# Patient Record
Sex: Female | Born: 1985 | Hispanic: Yes | Marital: Married | Smoking: Former smoker
Health system: Southern US, Community
[De-identification: ages and names within clinical notes are randomized; demographics above are authoritative.]

## PROBLEM LIST (undated history)

## (undated) DIAGNOSIS — R87629 Unspecified abnormal cytological findings in specimens from vagina: Secondary | ICD-10-CM

## (undated) DIAGNOSIS — O1490 Unspecified pre-eclampsia, unspecified trimester: Secondary | ICD-10-CM

## (undated) DIAGNOSIS — F32A Depression, unspecified: Secondary | ICD-10-CM

## (undated) DIAGNOSIS — Z8619 Personal history of other infectious and parasitic diseases: Secondary | ICD-10-CM

## (undated) DIAGNOSIS — A630 Anogenital (venereal) warts: Secondary | ICD-10-CM

## (undated) DIAGNOSIS — F329 Major depressive disorder, single episode, unspecified: Secondary | ICD-10-CM

## (undated) DIAGNOSIS — G43909 Migraine, unspecified, not intractable, without status migrainosus: Secondary | ICD-10-CM

## (undated) DIAGNOSIS — O24419 Gestational diabetes mellitus in pregnancy, unspecified control: Secondary | ICD-10-CM

## (undated) DIAGNOSIS — Z8742 Personal history of other diseases of the female genital tract: Secondary | ICD-10-CM

## (undated) HISTORY — DX: Unspecified pre-eclampsia, unspecified trimester: O14.90

## (undated) HISTORY — DX: Anogenital (venereal) warts: A63.0

## (undated) HISTORY — PX: REFRACTIVE SURGERY: SHX103

## (undated) HISTORY — DX: Depression, unspecified: F32.A

## (undated) HISTORY — DX: Major depressive disorder, single episode, unspecified: F32.9

## (undated) HISTORY — DX: Personal history of other diseases of the female genital tract: Z87.42

## (undated) HISTORY — DX: Migraine, unspecified, not intractable, without status migrainosus: G43.909

## (undated) HISTORY — DX: Unspecified abnormal cytological findings in specimens from vagina: R87.629

## (undated) HISTORY — DX: Personal history of other infectious and parasitic diseases: Z86.19

---

## 2018-05-02 ENCOUNTER — Encounter (INDEPENDENT_AMBULATORY_CARE_PROVIDER_SITE_OTHER): Payer: Self-pay

## 2018-05-02 ENCOUNTER — Ambulatory Visit: Payer: Managed Care, Other (non HMO) | Admitting: Internal Medicine

## 2018-05-02 ENCOUNTER — Encounter: Payer: Self-pay | Admitting: Internal Medicine

## 2018-05-02 VITALS — BP 120/78 | HR 68 | Temp 98.1°F | Ht 63.0 in | Wt 178.0 lb

## 2018-05-02 DIAGNOSIS — F325 Major depressive disorder, single episode, in full remission: Secondary | ICD-10-CM | POA: Diagnosis not present

## 2018-05-02 DIAGNOSIS — G43C1 Periodic headache syndromes in child or adult, intractable: Secondary | ICD-10-CM

## 2018-05-02 HISTORY — DX: Major depressive disorder, single episode, in full remission: F32.5

## 2018-05-02 HISTORY — DX: Periodic headache syndromes in child or adult, intractable: G43.C1

## 2018-05-02 MED ORDER — SUMATRIPTAN SUCCINATE 25 MG PO TABS: 25.0000 mg | ORAL_TABLET | ORAL | 0 refills | Status: DC | PRN

## 2018-05-02 NOTE — Assessment & Plan Note (Signed)
Currently not an issue Will monitor 

## 2018-05-02 NOTE — Progress Notes (Signed)
HPI  Pt presents to the clinic today to establish care and for management of the conditions listed below. [facility].  Depression: Diagnosed as a teenager. She has taken medication in the past but she reports she stopped it because she did not feel herself. She has seen a therapist in the past with good relief.  Migraines: Triggered by dehydration, stress, menstrual cycles. She reports these occur 1-3 x a month. She is taking Axert and reports some times it works, some times it doesn't. She has also tried Tylenol, Excedrin Migraine with minimal relief.  Flu: never Tetanus: < 10 years Pap Smear: 06/2017 Dentist: annually  Past Medical History:  Diagnosis Date  . Depression   . Genital warts   . History of chicken pox   . Migraine     Current Outpatient Medications  Medication Sig Dispense Refill  . almotriptan (AXERT) 12.5 MG tablet      No current facility-administered medications for this visit.     Allergies  Allergen Reactions  . Fluconazole Hives  . Sulfamethoxazole-Trimethoprim Hives and Swelling    Angioedema    Family History  Problem Relation Age of Onset  . Miscarriages / [place] Mother   . Hypertension Mother   . Esophageal cancer Maternal Grandmother   . Hyperlipidemia Maternal Grandmother   . Hypertension Maternal Grandmother   . Alcohol abuse Maternal Grandfather   . Hyperlipidemia Paternal Grandmother   . Hypertension Paternal Grandmother   . Hyperlipidemia Paternal Grandfather   . Hypertension Paternal Grandfather   . Liver cancer Paternal Grandfather     Social History   Socioeconomic History  . Marital status: Single    Spouse name: Not on file  . Number of children: Not on file  . Years of education: Not on file  . Highest education level: Not on file  Occupational History  . Not on file  Social Needs  . Financial resource strain: Not on file  . Food insecurity:    Worry: Not on file     Inability: Not on file  . Transportation needs:    Medical: Not on file    Non-medical: Not on file  Tobacco Use  . Smoking status: Current Every Day Smoker    Packs/day: 0.18    Types: Cigarettes  . Smokeless tobacco: Never Used  Substance and Sexual Activity  . Alcohol use: Yes    Frequency: Never    Comment: rare  . Drug use: Never  . Sexual activity: Not on file  Lifestyle  . Physical activity:    Days per week: Not on file    Minutes per session: Not on file  . Stress: Not on file  Relationships  . Social connections:    Talks on phone: Not on file    Gets together: Not on file    Attends religious service: Not on file    Active member of club or organization: Not on file    Attends meetings of clubs or organizations: Not on file    Relationship status: Not on file  . Intimate partner violence:    Fear of current or ex partner: Not on file    Emotionally abused: Not on file    Physically abused: Not on file    Forced sexual activity: Not on file  Other Topics Concern  . Not on file  Social History Narrative  . Not on file    ROS:  Constitutional: Pt reports migraines.  Denies fever, malaise, fatigue, or abrupt weight changes.  HEENT: Denies eye pain, eye redness, ear pain, ringing in the ears, wax buildup, runny nose, nasal congestion, bloody nose, or sore throat. Respiratory: Denies difficulty breathing, shortness of breath, cough or sputum production.   Cardiovascular: Denies chest pain, chest tightness, palpitations or swelling in the hands or feet.  Gastrointestinal: Denies abdominal pain, bloating, constipation, diarrhea or blood in the stool.  GU: Denies frequency, urgency, pain with urination, blood in urine, odor or discharge. Musculoskeletal: Denies decrease in range of motion, difficulty with gait, muscle pain or joint pain and swelling.  Skin: Denies redness, rashes, lesions or ulcercations.  Neurological: Denies dizziness, difficulty with memory,  difficulty with speech or problems with balance and coordination.  Psych: Denies anxiety, depression, SI/HI.  No other specific complaints in a complete review of systems (except as listed in HPI above).  PE:  BP 120/78   Pulse 68   Temp 98.1 F (36.7 C) (Oral)   Ht 5\' 3"  (1.6 m)   Wt 178 lb (80.7 kg)   LMP 04/20/2018   SpO2 98%   BMI 31.53 kg/m   Wt Readings from Last 3 Encounters:  05/02/18 178 lb (80.7 kg)    General: Appears her stated age, well developed, well nourished in NAD. Skin: Dry and intact. Cardiovascular: Normal rate and rhythm.  Pulmonary/Chest: Normal effort and positive vesicular breath sounds. No respiratory distress. No wheezes, rales or ronchi noted.  Neurological: Alert and oriented. Coordination normal.  Psychiatric: Mood and affect normal. Behavior is normal. Judgment and thought content normal.     Assessment and Plan:

## 2018-05-02 NOTE — Patient Instructions (Signed)

## 2018-05-02 NOTE — Assessment & Plan Note (Signed)
D/c Axert RX for Imitrex 25 mg tabs- use as directed Avoid triggers

## 2018-06-13 ENCOUNTER — Telehealth: Payer: Self-pay | Admitting: Internal Medicine

## 2018-06-13 NOTE — Telephone Encounter (Signed)
Copied from CRM [id number]. Topic: Quick Communication - See Telephone Encounter >> Jun 13, 2018  4:08 PM Terisa Starraylor, Brittany L wrote: CRM for notification. See Telephone encounter for: 06/13/18.  Patient woke up this morning with a cold sore right under her lip. She has been using Abreva and she feels like the sore is getting slightly bigger and was wondering if Nicki ReaperRegina Baity would give her something for it.   [id number] - [place], [place] - [address]

## 2018-06-13 NOTE — Telephone Encounter (Signed)
I spoke with pt;pt established care on 05/02/18;offered pt appt to be seen for fever blister since Abreva is not helping; pt has CPX scheduled on 06/25/18 and pt cannot afford 2 copays. Pt request antiviral to [facility] [place]. Pt request cb on 06/14/18 after Pamala Hurry Baity NP reviews. Pt said fever blister started as 3 small fever blisters that has turned into 1 blister under lower lip on rt side about the size of a pencil eraser in crescent shape. Pt said is painful and needs prescription med. Please advise.

## 2018-06-14 NOTE — Telephone Encounter (Signed)
She has to be seen. She can try an E visit if she can't come in for an appt

## 2018-06-25 ENCOUNTER — Encounter: Payer: Managed Care, Other (non HMO) | Admitting: Internal Medicine

## 2018-11-28 NOTE — L&D Delivery Note (Signed)
Delivery Note At 2:39 AM a viable and healthy female was delivered via Vaginal, Spontaneous, left hand compound (Presentation:vtx ; LOA ).  APGAR: 9, 9; weight pending .   Placenta status: spontaneous ,intact, not sent  .  Cord: CAN x 1  with the following complications: .  Cord pH: none  Anesthesia:  epidural Episiotomy:  none Lacerations: 2nd degree Perineal; right vaginal Sulcus;Labial majus bilateral Suture Repair: 3.0 chromic Est. Blood Loss (mL): 243  Mom to postpartum.  Baby to Couplet care / Skin to Skin.  Bailey Schwartz A Ellison Leisure 02/05/2019, 3:22 AM

## 2018-12-24 ENCOUNTER — Encounter: Payer: Self-pay | Admitting: Skilled Nursing Facility1

## 2018-12-24 ENCOUNTER — Encounter: Payer: Managed Care, Other (non HMO) | Attending: Obstetrics and Gynecology | Admitting: Skilled Nursing Facility1

## 2018-12-24 DIAGNOSIS — O24419 Gestational diabetes mellitus in pregnancy, unspecified control: Secondary | ICD-10-CM | POA: Insufficient documentation

## 2018-12-24 NOTE — Progress Notes (Signed)
Diabetes Self-Management Education  Visit Type: First/Initial  12/24/2018  Ms. Bailey Schwartz, identified by name and date of birth, is a 33 y.o. female with a diagnosis of Diabetes: Gestational Diabetes.   ASSESSMENT  Height 5\' 3"  (1.6 m), weight 234 lb (106.1 kg). Body mass index is 41.45 kg/m.  Pt states this is her first pregnancy and she is about [redacted] weeks along.  Pt states she has stopped eating fried foods.  Pt states she tries to have 2-3 snacks a day but due tot work cannot.  Fasting: 70-80's 2 hours post prandial: 100, 115  Diabetes Self-Management Education - 12/24/18 1507      Visit Information   Visit Type  First/Initial      Initial Visit   Diabetes Type  Gestational Diabetes    Are you currently following a meal plan?  No    Are you taking your medications as prescribed?  Not on Medications      Health Coping   How would you rate your overall health?  Fair      Psychosocial Assessment   Patient Belief/Attitude about Diabetes  Defeat/Burnout    Self-care barriers  None    Self-management support  Friends;Family      Pre-Education Assessment   Patient understands the diabetes disease and treatment process.  Needs Instruction    Patient understands incorporating nutritional management into lifestyle.  Needs Instruction    Patient undertands incorporating physical activity into lifestyle.  Needs Instruction    Patient understands using medications safely.  Needs Instruction    Patient understands monitoring blood glucose, interpreting and using results  Needs Instruction    Patient understands prevention, detection, and treatment of acute complications.  Needs Instruction    Patient understands prevention, detection, and treatment of chronic complications.  Needs Instruction    Patient understands how to develop strategies to address psychosocial issues.  Needs Instruction    Patient understands how to develop strategies to promote health/change behavior.  Needs  Instruction      Complications   How often do you check your blood sugar?  3-4 times/day    Fasting Blood glucose range (mg/dL)  [phone]    Postprandial Blood glucose range (mg/dL)  [phone]    Number of hypoglycemic episodes per month  0    Number of hyperglycemic episodes per week  0    Have you had a dilated eye exam in the past 12 months?  No    Have you had a dental exam in the past 12 months?  No    Are you checking your feet?  N/A      Dietary Intake   Breakfast  scrambled eggs with [place]urkey bacon and protein shake    Snack (morning)  yogurt     Lunch  grilled chicken salad with strawberries from [employer]     Dinner  grilled chicken with broccoli and corn    Snack (evening)  peanut butter or cheese    Beverage(s)  water, sparkling water      Exercise   Exercise Type  ADL's    How many days per week to you exercise?  0    How many minutes per day do you exercise?  0    Total minutes per week of exercise  0      Patient Education   Previous Diabetes Education  No    Disease state   Factors that contribute to the development of diabetes;Explored patient's options for treatment of  their diabetes    Nutrition management   Role of diet in the treatment of diabetes and the relationship between the three main macronutrients and blood glucose level;Food label reading, portion sizes and measuring food.;Carbohydrate counting;Reviewed blood glucose goals for pre and post meals and how to evaluate the patients' food intake on their blood glucose level.;Information on hints to eating out and maintain blood glucose control.    Physical activity and exercise   Role of exercise on diabetes management, blood pressure control and cardiac health.;Identified with patient nutritional and/or medication changes necessary with exercise.;Helped patient identify appropriate exercises in relation to his/her diabetes, diabetes complications and other health issue.    Monitoring  Purpose and frequency of  SMBG.;Taught/evaluated SMBG meter.;Identified appropriate SMBG and/or A1C goals.;Taught/discussed recording of test results and interpretation of SMBG.    Acute complications  Taught treatment of hypoglycemia - the 15 rule.;Discussed and identified patients' treatment of hyperglycemia.    Chronic complications  Relationship between chronic complications and blood glucose control;Assessed and discussed foot care and prevention of foot problems;Identified and discussed with patient  current chronic complications    Psychosocial adjustment  Worked with patient to identify barriers to care and solutions;Role of stress on diabetes    Preconception care  Reviewed with patient blood glucose goals with pregnancy      Individualized Goals (developed by patient)   Nutrition  General guidelines for healthy choices and portions discussed;Follow meal plan discussed    Physical Activity  Exercise 5-7 days per week;30 minutes per day    Monitoring   test my blood glucose as discussed;test blood glucose pre and post meals as discussed      Post-Education Assessment   Patient understands the diabetes disease and treatment process.  Demonstrates understanding / competency    Patient understands incorporating nutritional management into lifestyle.  Demonstrates understanding / competency    Patient undertands incorporating physical activity into lifestyle.  Demonstrates understanding / competency    Patient understands using medications safely.  Demonstrates understanding / competency    Patient understands monitoring blood glucose, interpreting and using results  Demonstrates understanding / competency    Patient understands prevention, detection, and treatment of acute complications.  Demonstrates understanding / competency    Patient understands prevention, detection, and treatment of chronic complications.  Demonstrates understanding / competency    Patient understands how to develop strategies to address  psychosocial issues.  Demonstrates understanding / competency    Patient understands how to develop strategies to promote health/change behavior.  Demonstrates understanding / competency      Outcomes   Expected Outcomes  Demonstrated interest in learning. Expect positive outcomes    Future DMSE  PRN    Program Status  Completed       Individualized Plan for Diabetes Self-Management Training:   Learning Objective:  Patient will have a greater understanding of diabetes self-management. Patient education plan is to attend individual and/or group sessions per assessed needs and concerns.   Plan:   There are no Patient Instructions on file for this visit.  Expected Outcomes:  Demonstrated interest in learning. Expect positive outcomes  Education material provided: Food label handouts, Meal plan card, My Plate and Snack sheet  If problems or questions, patient to contact team via:  Phone  Future DSME appointment: PRN

## 2019-02-04 ENCOUNTER — Inpatient Hospital Stay (HOSPITAL_COMMUNITY): Payer: Managed Care, Other (non HMO) | Admitting: Anesthesiology

## 2019-02-04 ENCOUNTER — Other Ambulatory Visit: Payer: Self-pay

## 2019-02-04 ENCOUNTER — Inpatient Hospital Stay (HOSPITAL_COMMUNITY)
Admission: AD | Admit: 2019-02-04 | Discharge: 2019-02-07 | DRG: 807 | Disposition: A | Discharge status: Home / Self Care | Payer: Managed Care, Other (non HMO) | Attending: Obstetrics and Gynecology | Admitting: Obstetrics and Gynecology

## 2019-02-04 ENCOUNTER — Encounter (HOSPITAL_COMMUNITY): Payer: Self-pay | Admitting: *Deleted

## 2019-02-04 DIAGNOSIS — F17210 Nicotine dependence, cigarettes, uncomplicated: Secondary | ICD-10-CM | POA: Diagnosis present

## 2019-02-04 DIAGNOSIS — O9902 Anemia complicating childbirth: Secondary | ICD-10-CM | POA: Diagnosis present

## 2019-02-04 DIAGNOSIS — O99824 Streptococcus B carrier state complicating childbirth: Secondary | ICD-10-CM | POA: Diagnosis present

## 2019-02-04 DIAGNOSIS — O1490 Unspecified pre-eclampsia, unspecified trimester: Secondary | ICD-10-CM | POA: Diagnosis present

## 2019-02-04 DIAGNOSIS — O24420 Gestational diabetes mellitus in childbirth, diet controlled: Secondary | ICD-10-CM | POA: Diagnosis present

## 2019-02-04 DIAGNOSIS — O99214 Obesity complicating childbirth: Secondary | ICD-10-CM | POA: Diagnosis present

## 2019-02-04 DIAGNOSIS — O1494 Unspecified pre-eclampsia, complicating childbirth: Principal | ICD-10-CM | POA: Diagnosis present

## 2019-02-04 DIAGNOSIS — O134 Gestational [pregnancy-induced] hypertension without significant proteinuria, complicating childbirth: Secondary | ICD-10-CM | POA: Diagnosis present

## 2019-02-04 DIAGNOSIS — D509 Iron deficiency anemia, unspecified: Secondary | ICD-10-CM | POA: Diagnosis present

## 2019-02-04 DIAGNOSIS — Z3A37 37 weeks gestation of pregnancy: Secondary | ICD-10-CM

## 2019-02-04 DIAGNOSIS — O99334 Smoking (tobacco) complicating childbirth: Secondary | ICD-10-CM | POA: Diagnosis present

## 2019-02-04 HISTORY — DX: Gestational diabetes mellitus in pregnancy, unspecified control: O24.419

## 2019-02-04 LAB — COMPREHENSIVE METABOLIC PANEL
ALT: 25 U/L (ref 0–44)
AST: 22 U/L (ref 15–41)
Albumin: 2.6 g/dL — ABNORMAL LOW (ref 3.5–5.0)
Alkaline Phosphatase: 160 U/L — ABNORMAL HIGH (ref 38–126)
Anion gap: 7 (ref 5–15)
BUN: 8 mg/dL (ref 6–20)
CHLORIDE: 107 mmol/L (ref 98–111)
CO2: 21 mmol/L — AB (ref 22–32)
Calcium: 8.6 mg/dL — ABNORMAL LOW (ref 8.9–10.3)
Creatinine, Ser: 0.65 mg/dL (ref 0.44–1.00)
GFR calc non Af Amer: >60 mL/min (ref >60)
GLUCOSE: 76 mg/dL (ref 70–99)
Potassium: 4.2 mmol/L (ref 3.5–5.1)
SODIUM: 135 mmol/L (ref 135–145)
Total Bilirubin: 0.4 mg/dL (ref 0.3–1.2)
Total Protein: 5.9 g/dL — ABNORMAL LOW (ref 6.5–8.1)

## 2019-02-04 LAB — CBC
HCT: 36.8 % (ref 36.0–46.0)
HEMOGLOBIN: 11.5 g/dL — AB (ref 12.0–15.0)
MCH: 26.8 pg (ref 26.0–34.0)
MCHC: 31.3 g/dL (ref 30.0–36.0)
MCV: 85.8 fL (ref 80.0–100.0)
Platelets: 277 10*3/uL (ref 150–400)
RBC: 4.29 MIL/uL (ref 3.87–5.11)
RDW: 15.5 % (ref 11.5–15.5)
WBC: 8.1 10*3/uL (ref 4.0–10.5)
nRBC: 0 % (ref 0.0–0.2)

## 2019-02-04 LAB — PROTEIN / CREATININE RATIO, URINE
Creatinine, Urine: 120.84 mg/dL
Protein Creatinine Ratio: 1.49 mg/mg{Cre} — ABNORMAL HIGH (ref 0.00–0.15)
TOTAL PROTEIN, URINE: 180 mg/dL

## 2019-02-04 LAB — GLUCOSE, CAPILLARY
Glucose-Capillary: 65 mg/dL — ABNORMAL LOW (ref 70–99)
Glucose-Capillary: 84 mg/dL (ref 70–99)

## 2019-02-04 LAB — TYPE AND SCREEN
ABO/RH(D): O POS
Antibody Screen: NEGATIVE

## 2019-02-04 LAB — URIC ACID: URIC ACID, SERUM: 5.2 mg/dL (ref 2.5–7.1)

## 2019-02-04 LAB — ABO/RH: ABO/RH(D): O POS

## 2019-02-04 MED ORDER — TERBUTALINE SULFATE 1 MG/ML IJ SOLN: 0.2500 mg | Freq: Once | INTRAMUSCULAR | Status: DC | PRN

## 2019-02-04 MED ORDER — OXYTOCIN 40 UNITS IN NORMAL SALINE INFUSION - SIMPLE MED: 2.5000 [IU]/h | INTRAVENOUS | Status: DC

## 2019-02-04 MED ORDER — PENICILLIN G 3 MILLION UNITS IVPB - SIMPLE MED
3.0000 10*6.[IU] | INTRAVENOUS | Status: DC
  Administered 2019-02-04: 3 10*6.[IU] via INTRAVENOUS
  Filled 2019-02-04 (×8): qty 100

## 2019-02-04 MED ORDER — PHENYLEPHRINE 40 MCG/ML (10ML) SYRINGE FOR IV PUSH (FOR BLOOD PRESSURE SUPPORT)
80.0000 ug | PREFILLED_SYRINGE | INTRAVENOUS | Status: DC | PRN
  Filled 2019-02-04: qty 10

## 2019-02-04 MED ORDER — ONDANSETRON HCL 4 MG/2ML IJ SOLN: 4.0000 mg | Freq: Four times a day (QID) | INTRAMUSCULAR | Status: DC | PRN

## 2019-02-04 MED ORDER — MAGNESIUM SULFATE BOLUS VIA INFUSION
4.0000 g | Freq: Once | INTRAVENOUS | Status: AC
  Administered 2019-02-04: 4 g via INTRAVENOUS
  Filled 2019-02-04: qty 500

## 2019-02-04 MED ORDER — MAGNESIUM SULFATE 40 G IN LACTATED RINGERS - SIMPLE
INTRAVENOUS | Status: AC
  Administered 2019-02-04: 4 g via INTRAVENOUS
  Filled 2019-02-04: qty 500

## 2019-02-04 MED ORDER — ACETAMINOPHEN 325 MG PO TABS: 650.0000 mg | ORAL_TABLET | ORAL | Status: DC | PRN

## 2019-02-04 MED ORDER — FENTANYL-BUPIVACAINE-NACL 0.5-0.125-0.9 MG/250ML-% EP SOLN
12.0000 mL/h | EPIDURAL | Status: DC | PRN
  Administered 2019-02-05: 12 mL/h via EPIDURAL
  Filled 2019-02-04 (×2): qty 250

## 2019-02-04 MED ORDER — MAGNESIUM SULFATE 40 G IN LACTATED RINGERS - SIMPLE
2.0000 g/h | INTRAVENOUS | Status: AC
  Administered 2019-02-05: 2 g/h via INTRAVENOUS
  Filled 2019-02-04: qty 500

## 2019-02-04 MED ORDER — SOD CITRATE-CITRIC ACID 500-334 MG/5ML PO SOLN: 30.0000 mL | ORAL | Status: DC | PRN

## 2019-02-04 MED ORDER — LIDOCAINE HCL (PF) 1 % IJ SOLN
30.0000 mL | INTRAMUSCULAR | Status: DC | PRN
  Filled 2019-02-04: qty 30

## 2019-02-04 MED ORDER — OXYCODONE-ACETAMINOPHEN 5-325 MG PO TABS: 2.0000 | ORAL_TABLET | ORAL | Status: DC | PRN

## 2019-02-04 MED ORDER — OXYTOCIN 40 UNITS IN NORMAL SALINE INFUSION - SIMPLE MED
1.0000 m[IU]/min | INTRAVENOUS | Status: DC
  Filled 2019-02-04: qty 1000

## 2019-02-04 MED ORDER — SODIUM CHLORIDE 0.9 % IV SOLN
5.0000 10*6.[IU] | Freq: Once | INTRAVENOUS | Status: AC
  Administered 2019-02-04: 5 10*6.[IU] via INTRAVENOUS
  Filled 2019-02-04 (×3): qty 5

## 2019-02-04 MED ORDER — OXYTOCIN BOLUS FROM INFUSION
500.0000 mL | Freq: Once | INTRAVENOUS | Status: AC
  Administered 2019-02-05: 500 mL via INTRAVENOUS

## 2019-02-04 MED ORDER — EPHEDRINE 5 MG/ML INJ
10.0000 mg | INTRAVENOUS | Status: DC | PRN
  Filled 2019-02-04: qty 2

## 2019-02-04 MED ORDER — LACTATED RINGERS IV SOLN: 500.0000 mL | Freq: Once | INTRAVENOUS | Status: DC

## 2019-02-04 MED ORDER — OXYTOCIN 10 UNIT/ML IJ SOLN: 10.0000 [IU] | Freq: Once | INTRAMUSCULAR | Status: DC

## 2019-02-04 MED ORDER — LACTATED RINGERS IV SOLN
500.0000 mL | INTRAVENOUS | Status: DC | PRN
  Administered 2019-02-05: 500 mL via INTRAVENOUS

## 2019-02-04 MED ORDER — DIPHENHYDRAMINE HCL 50 MG/ML IJ SOLN: 12.5000 mg | INTRAMUSCULAR | Status: DC | PRN

## 2019-02-04 MED ORDER — LACTATED RINGERS IV SOLN
INTRAVENOUS | Status: DC
  Administered 2019-02-04 – 2019-02-05 (×4): via INTRAVENOUS

## 2019-02-04 MED ORDER — SODIUM CHLORIDE (PF) 0.9 % IJ SOLN
INTRAMUSCULAR | Status: DC | PRN
  Administered 2019-02-04: 12 mL/h via EPIDURAL

## 2019-02-04 MED ORDER — LIDOCAINE HCL (PF) 1 % IJ SOLN
INTRAMUSCULAR | Status: DC | PRN
  Administered 2019-02-04 (×2): 4 mL via EPIDURAL

## 2019-02-04 MED ORDER — PHENYLEPHRINE 40 MCG/ML (10ML) SYRINGE FOR IV PUSH (FOR BLOOD PRESSURE SUPPORT)
80.0000 ug | PREFILLED_SYRINGE | INTRAVENOUS | Status: DC | PRN
  Filled 2019-02-04 (×2): qty 10

## 2019-02-04 MED ORDER — OXYCODONE-ACETAMINOPHEN 5-325 MG PO TABS: 1.0000 | ORAL_TABLET | ORAL | Status: DC | PRN

## 2019-02-04 NOTE — MAU Note (Signed)
Started contracting today, came from office 3 today, was 1 last wk.  BP was elevated.  Has been slowly creeping up. + protein in urine, reports and increase in swelling.  No HA today,denies visual changes or epigastric pain.

## 2019-02-04 NOTE — Anesthesia Procedure Notes (Signed)
Epidural Patient location during procedure: OB Start time: 02/04/2019 7:38 PM End time: 02/04/2019 7:46 PM  Staffing Anesthesiologist: Mal Amabile, MD Performed: anesthesiologist   Preanesthetic Checklist Completed: patient identified, site marked, surgical consent, pre-op evaluation, timeout performed, IV checked, risks and benefits discussed and monitors and equipment checked  Epidural Patient position: sitting Prep: site prepped and draped and DuraPrep Patient monitoring: continuous pulse ox and blood pressure Approach: midline Location: L3-L4 Injection technique: LOR air  Needle:  Needle type: Tuohy  Needle gauge: 17 G Needle length: 9 cm and 9 Needle insertion depth: 8 cm Catheter type: closed end flexible Catheter size: 19 Gauge Catheter at skin depth: 13 cm Test dose: negative and Other  Assessment Events: blood not aspirated, injection not painful, no injection resistance, negative IV test and no paresthesia  Additional Notes Patient identified. Risks and benefits discussed including failed block, incomplete  Pain control, post dural puncture headache, nerve damage, paralysis, blood pressure Changes, nausea, vomiting, reactions to medications-both toxic and allergic and post Partum back pain. All questions were answered. Patient expressed understanding and wished to proceed. Sterile technique was used throughout procedure. Epidural site was Dressed with sterile barrier dressing. No paresthesias, signs of intravascular injection Or signs of intrathecal spread were encountered.  Patient was more comfortable after the epidural was dosed. Please see RN's note for documentation of vital signs and FHR which are stable. Reason for block:procedure for pain

## 2019-02-04 NOTE — MAU Provider Note (Signed)
History     Chief Complaint  Patient presents with  . Hypertension  . Proteinuria   33yo G1P0  Female @ 32 4/7 weeks sent from office due to elevated BP as she presented for labor check. Intact membrane. VE per Dr Amado Nash was 3 cm. Pt has h/a that resolved. Denies epigastric pain. (+) leg swelling. H/a yesterday  OB History    Gravida  1   Para      Term      Preterm      AB      Living        SAB      TAB      Ectopic      Multiple      Live Births              Past Medical History:  Diagnosis Date  . Depression   . Diabetes mellitus without complication (HCC)   . Genital warts   . History of chicken pox   . Migraine     Past Surgical History:  Procedure Laterality Date  . REFRACTIVE SURGERY      Family History  Problem Relation Age of Onset  . Miscarriages / [place] Mother   . Hypertension Mother   . Esophageal cancer Maternal Grandmother   . Hyperlipidemia Maternal Grandmother   . Hypertension Maternal Grandmother   . Alcohol abuse Maternal Grandfather   . Hyperlipidemia Paternal Grandmother   . Hypertension Paternal Grandmother   . Hyperlipidemia Paternal Grandfather   . Hypertension Paternal Grandfather   . Liver cancer Paternal Grandfather     Social History   Tobacco Use  . Smoking status: Current Every Day Smoker    Packs/day: 0.18    Types: Cigarettes  . Smokeless tobacco: Never Used  Substance Use Topics  . Alcohol use: Yes    Frequency: Never    Comment: rare  . Drug use: Never    Allergies:  Allergies  Allergen Reactions  . Fluconazole Hives  . Sulfamethoxazole-Trimethoprim Hives and Swelling    Angioedema    Medications Prior to Admission  Medication Sig Dispense Refill Last Dose  . almotriptan (AXERT) 12.5 MG tablet      . SUMAtriptan (IMITREX) 25 MG tablet Take 1 tablet (25 mg total) by mouth every 2 (two) hours as needed for migraine. May repeat in 2 hours if headache persists or recurs. 10 tablet 0       Physical Exam   Blood pressure (!) 138/93, pulse 93, temperature 98.3 F (36.8 C), temperature source Oral, resp. rate 18, height 5\' 3"  (1.6 m), weight 111.2 kg, last menstrual period 04/20/2018, SpO2 100 %.  General appearance: alert, cooperative and no distress Lungs: clear to auscultation bilaterally Abdomen: gravid palp mod ctx Pelvic: 4/90/-2 Extremities: edema 1-2 +  Tracing: baseline 130 (+) accels Ctx q 6 mins  ED Course  IMP: Gestational HTN Class A1 GDM IUP@ 37 4/7 P) PIH labs MDM  addendum: CBC    Component Value Date/Time   WBC 8.1 02/04/2019 1553   RBC 4.29 02/04/2019 1553   HGB 11.5 (L) 02/04/2019 1553   HCT 36.8 02/04/2019 1553   PLT 277 02/04/2019 1553   MCV 85.8 02/04/2019 1553   MCH 26.8 02/04/2019 1553   MCHC 31.3 02/04/2019 1553   RDW 15.5 02/04/2019 1553   CMP Latest Ref Rng & Units 02/04/2019  Glucose 70 - 99 mg/dL 76  BUN 6 - 20 mg/dL 8  Creatinine 9.47 - 0.96  mg/dL 4.78  Sodium 295 - 621 mmol/L 135  Potassium 3.5 - 5.1 mmol/L 4.2  Chloride 98 - 111 mmol/L 107  CO2 22 - 32 mmol/L 21(L)  Calcium 8.9 - 10.3 mg/dL 3.0(Q)  Total Protein 6.5 - 8.1 g/dL 5.9(L)  Total Bilirubin 0.3 - 1.2 mg/dL 0.4  Alkaline Phos 38 - 126 U/L 160(H)  AST 15 - 41 U/L 22  ALT 0 - 44 U/L 25  protein/creatinine ratio=1.49 Given BP and ratio, will proceed with IOL. Pt advised baby may require NICU due to age. Start magnesium. IV PCN( +) GBS   Serita Kyle, MD 3:59 PM 02/04/2019

## 2019-02-04 NOTE — H&P (Signed)
Bailey Schwartz is a 33 y.o. female female admitted for @ 37 4/7 weeks due to PIH/ preeclampsia  and early labor.  (+) GBS. (+) ctx. Intact membrane. PNC complicated by Class A1 GDM. Last sono <2 wk ago 6lb 11 oz. Denies RUQ pain, epigastric pain. H/a yesterday  OB History    Gravida  1   Para      Term      Preterm      AB      Living        SAB      TAB      Ectopic      Multiple      Live Births             Past Medical History:  Diagnosis Date  . Depression   . Genital warts   . Gestational diabetes   . History of chicken pox   . Migraine    Past Surgical History:  Procedure Laterality Date  . REFRACTIVE SURGERY     Family History: family history includes Alcohol abuse in her maternal grandfather; Esophageal cancer in her maternal grandmother; Hyperlipidemia in her maternal grandmother, paternal grandfather, and paternal grandmother; Hypertension in her maternal grandmother, mother, paternal grandfather, and paternal grandmother; Liver cancer in her paternal grandfather; Miscarriages / [place] in her mother. Social History:  reports that she has been smoking cigarettes. She has been smoking about 0.18 packs per day. She has never used smokeless tobacco. She reports current alcohol use. She reports that she does not use drugs.     Maternal Diabetes: Yes:  Diabetes Type:  Diet controlled Genetic Screening: Normal Maternal Ultrasounds/Referrals: Normal Fetal Ultrasounds or other Referrals:  None Maternal Substance Abuse:  No Significant Maternal Medications:  None Significant Maternal Lab Results:  Lab values include: Group B Strep positive Other Comments:  None  Review of Systems  Eyes: Negative for double vision.  Cardiovascular: Positive for leg swelling.  Gastrointestinal: Negative for heartburn and vomiting.   Maternal Medical History:  Reason for admission: Contractions.   Contractions: Onset was 3-5 hours ago.   Frequency: irregular.    Perceived severity is moderate.    Fetal activity: Perceived fetal activity is normal.    Prenatal Complications - Diabetes: gestational. Diabetes is managed by diet.      Dilation: 4 Effacement (%): 90 Station: -3 Exam by:: Dr. Cherly Hensen Blood pressure (!) 141/96, pulse 94, temperature 98.3 F (36.8 C), temperature source Oral, resp. rate 18, height 5\' 3"  (1.6 m), weight 111.2 kg, last menstrual period 04/20/2018, SpO2 100 %. Exam Physical Exam  Constitutional: She is oriented to person, place, and time. She appears well-developed and well-nourished.  HENT:  Head: Atraumatic.  Eyes: EOM are normal.  Neck: Neck supple.  Cardiovascular: Regular rhythm.  Respiratory: Breath sounds normal.  GI: Soft.  Musculoskeletal:        General: Edema present.  Neurological: She is alert and oriented to person, place, and time. She has normal reflexes.  Skin: Skin is warm and dry.  Psychiatric: She has a normal mood and affect.   VE 4/90/-3 Prenatal labs: ABO, Rh: --/--/O POS, O POS Performed at [facility], [address], [place], [place] [address]  [phone]) Antibody: NEG (03/09 1554) Rubella:  Immune RPR:   NR HBsAg:   neg HIV:   neg GBS:   positive CBC Latest Ref Rng & Units 02/04/2019  WBC 4.0 - 10.5 K/uL 8.1  Hemoglobin 12.0 - 15.0  g/dL 11.5(L)  Hematocrit 36.0 - 46.0 % 36.8  Platelets 150 - 400 K/uL 277   CMP Latest Ref Rng & Units 02/04/2019  Glucose 70 - 99 mg/dL 76  BUN 6 - 20 mg/dL 8  Creatinine 8.31 - 5.17 mg/dL 6.16  Sodium 073 - 710 mmol/L 135  Potassium 3.5 - 5.1 mmol/L 4.2  Chloride 98 - 111 mmol/L 107  CO2 22 - 32 mmol/L 21(L)  Calcium 8.9 - 10.3 mg/dL 6.2(I)  Total Protein 6.5 - 8.1 g/dL 5.9(L)  Total Bilirubin 0.3 - 1.2 mg/dL 0.4  Alkaline Phos 38 - 126 U/L 160(H)  AST 15 - 41 U/L 22  ALT 0 - 44 U/L 25  protein/creatinine ratio 1.49 Assessment/Plan: PIH/preeclampsia Class A1 GDM  (+) GBS IUP@ 37 4/7 weeks P) admit. Magnesium sulfate. IV  PCN, pitocin augmentation. Epidural. Amniotomy prn  Bailey Schwartz 02/04/2019, 6:43 PM

## 2019-02-04 NOTE — Progress Notes (Signed)
S: comfortable O: BP 119/82   Pulse 95   Temp 98.3 F (36.8 C) (Oral)   Resp 18   Ht 5\' 3"  (1.6 m)   Wt 111.2 kg   LMP 04/20/2018   SpO2 99%   BMI 43.42 kg/m  Pitocin MgSO4 VE: 5/90/-1 transverse AROM clear fluid IUPC/ISE placed  Tracing: baseline 130 (+) accel Ctx q 3-  IMP: latent/active phase PIH on magnesium sulfate (+) GBS cx  On IV PCN Class A1 GDM IUP @ 37 4/7 weeks P) right exaggerates sims. Cont pitocin. Cont IV PCN. Check magnesium level

## 2019-02-04 NOTE — Anesthesia Preprocedure Evaluation (Signed)
Anesthesia Evaluation  Patient identified by MRN, date of birth, ID band Patient awake    Reviewed: Allergy & Precautions, Patient's Chart, lab work & pertinent test results  Airway Mallampati: III  TM Distance: >3 FB Neck ROM: Full    Dental no notable dental hx. (+) Teeth Intact   Pulmonary Current Smoker,    Pulmonary exam normal breath sounds clear to auscultation       Cardiovascular hypertension, Pt. on medications Normal cardiovascular exam Rhythm:Regular Rate:Normal     Neuro/Psych  Headaches, PSYCHIATRIC DISORDERS Depression    GI/Hepatic Neg liver ROS, GERD  ,  Endo/Other  diabetes, GestationalMorbid obesity  Renal/GU negative Renal ROS  negative genitourinary   Musculoskeletal negative musculoskeletal ROS (+)   Abdominal (+) + obese,   Peds  Hematology negative hematology ROS (+)   Anesthesia Other Findings   Reproductive/Obstetrics (+) Pregnancy Pre eclampsia- on MgSO4 37 4/7 weeks Genital warts                             Anesthesia Physical Anesthesia Plan  ASA: III  Anesthesia Plan: Epidural   Post-op Pain Management:    Induction:   PONV Risk Score and Plan:   Airway Management Planned: Natural Airway  Additional Equipment:   Intra-op Plan:   Post-operative Plan:   Informed Consent: I have reviewed the patients History and Physical, chart, labs and discussed the procedure including the risks, benefits and alternatives for the proposed anesthesia with the patient or authorized representative who has indicated his/her understanding and acceptance.       Plan Discussed with: Anesthesiologist  Anesthesia Plan Comments:         Anesthesia Quick Evaluation

## 2019-02-04 NOTE — Progress Notes (Signed)
CBG 65.  Gave 4oz juice. Will reassess CBG level.

## 2019-02-05 ENCOUNTER — Encounter (HOSPITAL_COMMUNITY): Payer: Self-pay

## 2019-02-05 DIAGNOSIS — O1490 Unspecified pre-eclampsia, unspecified trimester: Secondary | ICD-10-CM | POA: Diagnosis present

## 2019-02-05 LAB — CBC
HEMATOCRIT: 32.9 % — AB (ref 36.0–46.0)
HEMOGLOBIN: 10.2 g/dL — AB (ref 12.0–15.0)
MCH: 26.8 pg (ref 26.0–34.0)
MCHC: 31 g/dL (ref 30.0–36.0)
MCV: 86.4 fL (ref 80.0–100.0)
Platelets: 240 10*3/uL (ref 150–400)
RBC: 3.81 MIL/uL — ABNORMAL LOW (ref 3.87–5.11)
RDW: 15.3 % (ref 11.5–15.5)
WBC: 14.2 10*3/uL — ABNORMAL HIGH (ref 4.0–10.5)
nRBC: 0 % (ref 0.0–0.2)

## 2019-02-05 LAB — MAGNESIUM: Magnesium: 4.4 mg/dL — ABNORMAL HIGH (ref 1.7–2.4)

## 2019-02-05 LAB — RPR: RPR Ser Ql: NONREACTIVE

## 2019-02-05 MED ORDER — SENNOSIDES-DOCUSATE SODIUM 8.6-50 MG PO TABS
2.0000 | ORAL_TABLET | ORAL | Status: DC
  Administered 2019-02-05 – 2019-02-06 (×2): 2 via ORAL
  Filled 2019-02-05 (×2): qty 2

## 2019-02-05 MED ORDER — SIMETHICONE 80 MG PO CHEW: 80.0000 mg | CHEWABLE_TABLET | ORAL | Status: DC | PRN

## 2019-02-05 MED ORDER — ZOLPIDEM TARTRATE 5 MG PO TABS: 5.0000 mg | ORAL_TABLET | Freq: Every evening | ORAL | Status: DC | PRN

## 2019-02-05 MED ORDER — BENZOCAINE-MENTHOL 20-0.5 % EX AERO
1.0000 "application " | INHALATION_SPRAY | CUTANEOUS | Status: DC | PRN
  Administered 2019-02-05: 1 via TOPICAL
  Filled 2019-02-05: qty 56

## 2019-02-05 MED ORDER — ONDANSETRON HCL 4 MG/2ML IJ SOLN: 4.0000 mg | INTRAMUSCULAR | Status: DC | PRN

## 2019-02-05 MED ORDER — PRENATAL MULTIVITAMIN CH
1.0000 | ORAL_TABLET | Freq: Every day | ORAL | Status: DC
  Administered 2019-02-05 – 2019-02-07 (×3): 1 via ORAL
  Filled 2019-02-05 (×3): qty 1

## 2019-02-05 MED ORDER — OXYCODONE HCL 5 MG PO TABS: 10.0000 mg | ORAL_TABLET | ORAL | Status: DC | PRN

## 2019-02-05 MED ORDER — IBUPROFEN 600 MG PO TABS
600.0000 mg | ORAL_TABLET | Freq: Four times a day (QID) | ORAL | Status: DC
  Administered 2019-02-05 – 2019-02-07 (×11): 600 mg via ORAL
  Filled 2019-02-05 (×11): qty 1

## 2019-02-05 MED ORDER — DIBUCAINE 1 % RE OINT: 1.0000 "application " | TOPICAL_OINTMENT | RECTAL | Status: DC | PRN

## 2019-02-05 MED ORDER — WITCH HAZEL-GLYCERIN EX PADS: 1.0000 "application " | MEDICATED_PAD | CUTANEOUS | Status: DC | PRN

## 2019-02-05 MED ORDER — DIPHENHYDRAMINE HCL 25 MG PO CAPS: 25.0000 mg | ORAL_CAPSULE | Freq: Four times a day (QID) | ORAL | Status: DC | PRN

## 2019-02-05 MED ORDER — ONDANSETRON HCL 4 MG PO TABS: 4.0000 mg | ORAL_TABLET | ORAL | Status: DC | PRN

## 2019-02-05 MED ORDER — OXYCODONE HCL 5 MG PO TABS
5.0000 mg | ORAL_TABLET | ORAL | Status: DC | PRN
  Administered 2019-02-05: 5 mg via ORAL
  Filled 2019-02-05: qty 1

## 2019-02-05 MED ORDER — ACETAMINOPHEN 325 MG PO TABS
650.0000 mg | ORAL_TABLET | ORAL | Status: DC | PRN
  Administered 2019-02-05 – 2019-02-06 (×2): 650 mg via ORAL
  Filled 2019-02-05 (×2): qty 2

## 2019-02-05 MED ORDER — COCONUT OIL OIL
1.0000 "application " | TOPICAL_OIL | Status: DC | PRN
  Administered 2019-02-06: 1 via TOPICAL

## 2019-02-05 MED ORDER — FERROUS SULFATE 325 (65 FE) MG PO TABS
325.0000 mg | ORAL_TABLET | Freq: Two times a day (BID) | ORAL | Status: DC
  Administered 2019-02-05 – 2019-02-07 (×5): 325 mg via ORAL
  Filled 2019-02-05 (×5): qty 1

## 2019-02-05 NOTE — Progress Notes (Signed)
S; denies rectal pressure  O: BP 134/87   Pulse 99   Temp 97.9 F (36.6 C) (Oral)   Resp 20   Ht 5\' 3"  (1.6 m)   Wt 111.2 kg   LMP 04/20/2018   SpO2 99%   BMI 43.42 kg/m  VE complete/0 station Asynclitic  Tracing: baseline 135 (+) early decl Ctx q 2-3 mins CBG (last 3)  Recent Labs    02/04/19 2043 02/04/19 2243  GLUCAP 65* 84  magnesium 4.4  IMP: complete Class A1 GDM  GBS cx (+) P) right exaggerated sims. Labor vtx down

## 2019-02-05 NOTE — Anesthesia Postprocedure Evaluation (Signed)
Anesthesia Post Note  Patient: Avina Coldren  Procedure(s) Performed: AN AD HOC LABOR EPIDURAL     Patient location during evaluation: Mother Baby Anesthesia Type: Epidural Level of consciousness: awake Pain management: pain level controlled Vital Signs Assessment: post-procedure vital signs reviewed and stable Respiratory status: spontaneous breathing Cardiovascular status: stable Postop Assessment: patient able to bend at knees and epidural receding Anesthetic complications: no    Last Vitals:  Vitals:   02/05/19 0605 02/05/19 0701  BP:  129/83  Pulse:  97  Resp:  17  Temp:  36.9 C  SpO2: 99% 100%    Last Pain:  Vitals:   02/05/19 0722  TempSrc:   PainSc: Asleep   Pain Goal: Patients Stated Pain Goal: 5 (02/05/19 8295)                 Edison Pace

## 2019-02-05 NOTE — Progress Notes (Signed)
MOB was referred for history of depression.  * Referral screened out by Clinical Social Worker because none of the following criteria appear to apply:  -History of anxiety/depression during this pregnancy, or of post-partum depression following prior delivery. -Diagnosis of anxiety and/or depression within last 3 years. Per chart review, MOB reported being diagnosed back when she was in high school.  OR *MOB's symptoms currently being treated with medication and/or therapy.  Please contact the Clinical Social Worker if needs arise, by MOB request, or if MOB scores greater than 9/yes to question 10 on Edinburgh Postpartum Depression Screen.  Jaclyne Haverstick Irwin, LCSWA  [facility] [phone]   

## 2019-02-05 NOTE — Progress Notes (Signed)
Patient ID: Sharis Keeran, female   DOB: Dec 14, 1985, 33 y.o.   MRN: [id number] INTERVAL NOTE:  S:  Sitting in bed, FOB holding baby at [facility], min cramping, (+) voids, small bleed, denies HA/NV/dizziness  O:  VSS, AAO x 3, NAD  FF bellow U  Scant lochia   Intake/Output Summary (Last 24 hours) at 02/05/2019 1020 Last data filed at 02/05/2019 1000 Gross per 24 hour  Intake 3074.05 ml  Output 2073 ml  Net 1001.05 ml   CBC Latest Ref Rng & Units 02/05/2019 02/04/2019  WBC 4.0 - 10.5 K/uL 14.2(H) 8.1  Hemoglobin 12.0 - 15.0 g/dL 10.2(L) 11.5(L)  Hematocrit 36.0 - 46.0 % 32.9(L) 36.8  Platelets 150 - 400 K/uL 240 277   Hepatic Function Latest Ref Rng & Units 02/04/2019  Total Protein 6.5 - 8.1 g/dL 5.9(L)  Albumin 3.5 - 5.0 g/dL 2.6(L)  AST 15 - 41 U/L 22  ALT 0 - 44 U/L 25  Alk Phosphatase 38 - 126 U/L 160(H)  Total Bilirubin 0.3 - 1.2 mg/dL 0.4   A / P:    Principal Problem:   Postpartum care following vaginal delivery 3/10 Active Problems:   Preeclampsia  - Mag Sulfate x 24 hrs PP  - normotensive since delivery, no neural sx  - strict I&O until discharge    Obstetrical laceration -  2nd degree Perineal; right vaginal Sulcus;Labial majus bilateral  PPD #0  Stable post partum  Routine PP orders  PAUL,DANIELA, CNM, MSN  02/05/2019 10:18 AM

## 2019-02-05 NOTE — Lactation Note (Addendum)
This note was copied from a baby's chart. Lactation Consultation Note:  P1, Infant is 7 hours old and has had one feeding.  Mother reports that she is unsure if infant really latched on.   Teaching on cue base feeding and hand expression. Infant placed in cross cradle hold with good pillow support.  Mother taught off sided latch techniques.  Infant latched on the left breast . Infants lips widely flanged.  Father at the bedside and attentive to all teaching.  Infant sustained latch for 30 mins. Observed frequent suckling and swallows.  Mother taught football hold on the alternate breast. infant took a few sucks and then fell asleep.   Mother has a DEBP sat up at the bedside. She has used once.  Mother was given a harmony hand pump with instructions to use if unable to hand express or get anything with DEBP. Mother also has LPI guidelines in room. Discussed Early Term behaviors.   Advised mother to cue base feed, feed infant 8-12 times in 24 hours. Discussed cluster feeding.  Mother was given Lactation Brochure and basic breastfeeding teaching done.      Patient Name: Bailey Schwartz [id number]'[id number] Date: 02/05/2019 Reason for consult: Initial assessment   Maternal Data Has patient been taught Hand Expression?: Yes Does the patient have breastfeeding experience prior to this delivery?: No  Feeding Feeding Type: Breast Fed  LATCH Score Latch: Grasps breast easily, tongue down, lips flanged, rhythmical sucking.  Audible Swallowing: Spontaneous and intermittent  Type of Nipple: Everted at rest and after stimulation  Comfort (Breast/Nipple): Soft / non-tender  Hold (Positioning): Assistance needed to correctly position infant at breast and maintain latch.  LATCH Score: 9  Interventions Interventions: Breast feeding basics reviewed;Assisted with latch;Skin to skin;Breast massage;Hand express;Breast compression;Adjust position;Support pillows;Position options;Expressed milk;Hand  pump  Lactation Tools Discussed/Used Pump Review: Setup, frequency, and cleaning;Milk Storage Date initiated:: 02/06/19   Consult Status Consult Status: Follow-up Date: 02/05/19 Follow-up type: In-patient    Stevan Born [facility] 02/05/2019, 12:15 PM

## 2019-02-06 LAB — CBC
HCT: 26.7 % — ABNORMAL LOW (ref 36.0–46.0)
Hemoglobin: 8.4 g/dL — ABNORMAL LOW (ref 12.0–15.0)
MCH: 27.2 pg (ref 26.0–34.0)
MCHC: 31.5 g/dL (ref 30.0–36.0)
MCV: 86.4 fL (ref 80.0–100.0)
PLATELETS: 234 10*3/uL (ref 150–400)
RBC: 3.09 MIL/uL — ABNORMAL LOW (ref 3.87–5.11)
RDW: 15.8 % — ABNORMAL HIGH (ref 11.5–15.5)
WBC: 11.3 10*3/uL — ABNORMAL HIGH (ref 4.0–10.5)
nRBC: 0 % (ref 0.0–0.2)

## 2019-02-06 MED ORDER — BUTALBITAL-APAP-CAFFEINE 50-325-40 MG PO TABS
1.0000 | ORAL_TABLET | ORAL | Status: DC | PRN
  Administered 2019-02-06: 1 via ORAL
  Filled 2019-02-06 (×2): qty 1

## 2019-02-06 NOTE — Progress Notes (Signed)
Pt states has a h/a, mild - h/o migraines and feels that this is a mild migraine, has been taking ibuprofen but still mildly there - was taking fioricet prn in pregnancy, requesting that now  Patient Vitals for the past 24 hrs:  BP Temp Temp src Pulse Resp SpO2  02/06/19 1141 136/86 98 F (36.7 C) Oral 99 17 100 %  02/06/19 0932 126/79 98.1 F (36.7 C) Oral (!) 109 17 99 %  02/06/19 0546 124/83 98.5 F (36.9 C) Oral 100 17 -  02/05/19 2325 120/80 97.8 F (36.6 C) Oral 98 - -  02/05/19 2006 136/73 97.9 F (36.6 C) Oral 93 17 -  02/05/19 1857 - - - - 18 -  02/05/19 1828 123/77 98.1 F (36.7 C) Oral 98 18 99 %  02/05/19 1700 - - - - 18 -  02/05/19 1534 (!) 137/92 97.7 F (36.5 C) Oral 91 16 99 %  02/05/19 1400 - - - - 18 -  02/05/19 1300 - - - - 18 -   CBC Latest Ref Rng & Units 02/06/2019 02/05/2019 02/04/2019  WBC 4.0 - 10.5 K/uL 11.3(H) 14.2(H) 8.1  Hemoglobin 12.0 - 15.0 g/dL 1.8(H) 10.2(L) 11.5(L)  Hematocrit 36.0 - 46.0 % 26.7(L) 32.9(L) 36.8  Platelets 150 - 400 K/uL 234 240 277     A&ox3 rrr ctab Abd: soft, nt, nd; fundus firm and 2cm below umb LE: trace edema, nt bilat  A/P: ppd1 severe pre-e 1. Doing well, mild/normal bps now; s/p 24 hrs mag sulfate and stopped at 0230 this am; will monitor bps closely; if stable can plan d/c home tomorrow 2. Chronic anemia with acute change - monitor for sx though currently asymptomatic; plan iron

## 2019-02-06 NOTE — Lactation Note (Addendum)
This note was copied from a baby's chart. Lactation Consultation Note: Infant is 32 hours and is at 8 % weight loss. Mother reports that she was very upset when told how much infant had lost.  Mother reports that she has a rough night with infant being fussy as well as she was unable to get infant latched. She reports that he would fall asleep at the breast as well.   Mother has been supplementing with formula and pumping. She has not pumped but a few drops.   Mother to page LC to assist with latch and use #5 fr feeding tube for supplement.  Mother and father receptive to plan.  Mother advised to continue to so breast massage, hand express,  breastfeed then supplement. Suggested that she post pump for 15-20 min every 2-3 hours. Father bonding and cuddling infant . Encouraged STS.  Patient Name: Bailey Schwartz [id number] Date: 02/06/2019 Reason for consult: Follow-up assessment   Maternal Data Formula Feeding for Exclusion: No  Feeding Feeding Type: (mother to page LC to assist with # 5 feeding tube) Nipple Type: Slow - flow  LATCH Score                   Interventions Interventions: Skin to skin;Breast massage;Hand express;DEBP  Lactation Tools Discussed/Used     Consult Status Consult Status: Follow-up Date: 02/06/19 Follow-up type: In-patient    Stevan Born [facility] 02/06/2019, 10:49 AM

## 2019-02-07 MED ORDER — IBUPROFEN 600 MG PO TABS: 600.0000 mg | ORAL_TABLET | Freq: Four times a day (QID) | ORAL | 0 refills | Status: DC

## 2019-02-07 MED ORDER — POLYSACCHARIDE IRON COMPLEX 150 MG PO CAPS: 150.0000 mg | ORAL_CAPSULE | Freq: Every day | ORAL | 0 refills | Status: DC

## 2019-02-07 MED ORDER — MAGNESIUM OXIDE 400 (241.3 MG) MG PO TABS: 400.0000 mg | ORAL_TABLET | Freq: Every day | ORAL | 0 refills | Status: DC

## 2019-02-07 MED ORDER — MAGNESIUM OXIDE 400 (241.3 MG) MG PO TABS
400.0000 mg | ORAL_TABLET | Freq: Every day | ORAL | Status: DC
  Administered 2019-02-07: 400 mg via ORAL
  Filled 2019-02-07: qty 1

## 2019-02-07 MED ORDER — POLYSACCHARIDE IRON COMPLEX 150 MG PO CAPS: 150.0000 mg | ORAL_CAPSULE | Freq: Every day | ORAL | Status: DC

## 2019-02-07 NOTE — Progress Notes (Signed)
PPD 2 SVD with 2nd degree and sulcus repair / PEC / GDMA1  S:  Reports feeling well - ready to go home             Tolerating po/ No nausea or vomiting             Bleeding is light             Pain controlled with Motrin             Up ad lib / ambulatory / voiding QS  Newborn Breast   O:   VS: BP 113/70 (BP Location: Right Arm)   Pulse 96   Temp 98.1 F (36.7 C) (Oral)   Resp 18   Ht 5\' 3"  (1.6 m)   Wt 111.2 kg   LMP 04/20/2018   SpO2 98%   Breastfeeding Unknown   BMI 43.42 kg/m   BP: 113/70 - 115/75 - 130/78 - 142/96   LABS:             Recent Labs    02/05/19 0409 02/06/19 0527  WBC 14.2* 11.3*  HGB 10.2* 8.4*  PLT 240 234               Blood type: --/--/O POS, O POS Performed at [facility], [address] [place], [place] [address]  [phone])              Physical Exam:             Alert and oriented X3  Abdomen: soft, non-tender, non-distended              Fundus: firm, non-tender, Ueven  Perineum: no edema - some labial swellilng  Lochia: light  Extremities: trace edema, no calf pain or tenderness    A: PPD # 2 SVD with 2nd degree repair             IDA with compounded blood loss - stable             Preeclampsia - stable BP without agent   Doing well - stable status  P: Routine post partum orders  DC home - return to office next week for BP recheck or home visiting nurse this weekend  Marlinda Mike CNM, MSN, [facility] 02/07/2019, 3:55 PM

## 2019-02-07 NOTE — Discharge Instructions (Addendum)
Vaginal Delivery, Care After °Refer to this sheet in the next few weeks. These instructions provide you with information about caring for yourself after vaginal delivery. Your health care provider may also give you more specific instructions. Your treatment has been planned according to current medical practices, but problems sometimes occur. Call your health care provider if you have any problems or questions. °What can I expect after the procedure? °After vaginal delivery, it is common to have: °· Some bleeding from your vagina. °· Soreness in your abdomen, your vagina, and the area of skin between your vaginal opening and your anus (perineum). °· Pelvic cramps. °· Fatigue. °Follow these instructions at home: °Medicines °· Take over-the-counter and prescription medicines only as told by your health care provider. °· If you were prescribed an antibiotic medicine, take it as told by your health care provider. Do not stop taking the antibiotic until it is finished. °Driving ° °· Do not drive or operate heavy machinery while taking prescription pain medicine. °· Do not drive for 24 hours if you received a sedative. °Lifestyle °· Do not drink alcohol. This is especially important if you are breastfeeding or taking medicine to relieve pain. °· Do not use tobacco products, including cigarettes, chewing tobacco, or e-cigarettes. If you need help quitting, ask your health care provider. °Eating and drinking °· Drink at least 8 eight-ounce glasses of water every day unless you are told not to by your health care provider. If you choose to breastfeed your baby, you may need to drink more water than this. °· Eat high-fiber foods every day. These foods may help prevent or relieve constipation. High-fiber foods include: °? Whole grain cereals and breads. °? Brown rice. °? Beans. °? Fresh fruits and vegetables. °Activity °· Return to your normal activities as told by your health care provider. Ask your health care provider what  activities are safe for you. °· Rest as much as possible. Try to rest or take a nap when your baby is sleeping. °· Do not lift anything that is heavier than your baby or 10 lb (4.5 kg) until your health care provider says that it is safe. °· Talk with your health care provider about when you can engage in sexual activity. This may depend on your: °? Risk of infection. °? Rate of healing. °? Comfort and desire to engage in sexual activity. °Vaginal Care °· If you have an episiotomy or a vaginal tear, check the area every day for signs of infection. Check for: °? More redness, swelling, or pain. °? More fluid or blood. °? Warmth. °? Pus or a bad smell. °· Do not use tampons or douches until your health care provider says this is safe. °· Watch for any blood clots that may pass from your vagina. These may look like clumps of dark red, brown, or black discharge. °General instructions °· Keep your perineum clean and dry as told by your health care provider. °· Wear loose, comfortable clothing. °· Wipe from front to back when you use the toilet. °· Ask your health care provider if you can shower or take a bath. If you had an episiotomy or a perineal tear during labor and delivery, your health care provider may tell you not to take baths for a certain length of time. °· Wear a bra that supports your breasts and fits you well. °· If possible, have someone help you with household activities and help care for your baby for at least a few days after you   leave the hospital.  Keep all follow-up visits for you and your baby as told by your health care provider. This is important. Contact a health care provider if:  You have: ? Vaginal discharge that has a bad smell. ? Difficulty urinating. ? Pain when urinating. ? A sudden increase or decrease in the frequency of your bowel movements. ? More redness, swelling, or pain around your episiotomy or vaginal tear. ? More fluid or blood coming from your episiotomy or vaginal  tear. ? Pus or a bad smell coming from your episiotomy or vaginal tear. ? A fever. ? A rash. ? Little or no interest in activities you used to enjoy. ? Questions about caring for yourself or your baby.  Your episiotomy or vaginal tear feels warm to the touch.  Your episiotomy or vaginal tear is separating or does not appear to be healing.  Your breasts are painful, hard, or turn red.  You feel unusually sad or worried.  You feel nauseous or you vomit.  You pass large blood clots from your vagina. If you pass a blood clot from your vagina, save it to show to your health care provider. Do not flush blood clots down the toilet without having your health care provider look at them.  You urinate more than usual.  You are dizzy or light-headed.  You have not breastfed at all and you have not had a menstrual period for 12 weeks after delivery.  You have stopped breastfeeding and you have not had a menstrual period for 12 weeks after you stopped breastfeeding. Get help right away if:  You have: ? Pain that does not go away or does not get better with medicine. ? Chest pain. ? Difficulty breathing. ? Blurred vision or spots in your vision. ? Thoughts about hurting yourself or your baby.  You develop pain in your abdomen or in one of your legs.  You develop a severe headache.  You faint.  You bleed from your vagina so much that you fill two sanitary pads in one hour. This information is not intended to replace advice given to you by your health care provider. Make sure you discuss any questions you have with your health care provider. Document Released: 11/11/2000 Document Revised: 04/27/2016 Document Reviewed: 11/29/2015 Elsevier Interactive Patient Education  2019 [employer] Breastfeeding and Cracked or Sore Nipples It is normal to have some tenderness in your nipples when you start to breastfeed your new baby. Your nipples can also become cracked or sore. This may happen if  your baby or the baby's mouth is not in the right position when breastfeeding. There are things you can do to help avoid these problems. Follow these instructions at home: Breastfeeding strategy   Make sure your baby's mouth attaches to your nipple (latches) properly to breastfeed.  Make sure your baby is in the right position when breastfeeding. Try different positions to find one that works.  Have your baby feed from the less sore breast first.  Break the latch between your baby's mouth and your nipple before removing him or her from your breast. To do this: ? Put your little finger in between your nipple and your baby's gums. If you use a breast pump:  Make sure the part of the pump that goes over your nipple fits properly.  Start by setting the pump to a low setting. Increase the pump strength over time as needed. Too high of a setting may cause damage to your nipple. Breast care  To help your breasts and nipples stay healthy:  Avoid the use of soap on your nipples.  Wear a supportive bra. Avoid wearing underwire bras or tight bras.  Air-dry your nipples for 3-4 minutes after each feeding.  Use only cotton bra pads to soak up any breast milk that leaks. Be sure to change the pads if they become soaked with milk.  Put some lanolin on your nipples after breastfeeding. Pure lanolin does not need to be washed off your nipple before you feed your baby again. Pure lanolin is not harmful to your baby.  Rub some breast milk into your nipples: ? Use your hand to squeeze out a few drops of breast milk. ? Gently massage the milk into your nipples. ? Let your nipples air-dry. Contact a doctor if:  You have nipple pain.  You have soreness or cracking that lasts more than 1 week. Summary  It is normal to have some tenderness in your nipples when you start to breastfeed your new baby. But you should contact your doctor if you have nipple pain.  Your nipples can become cracked or sore  if your baby's mouth does not attach to your nipple properly when breastfeeding.  Rub lanolin or breast milk onto your nipples to keep them from getting cracked or sore. Avoid washing your nipples with soap. This information is not intended to replace advice given to you by your health care provider. Make sure you discuss any questions you have with your health care provider. Document Released: 06/22/2017 Document Revised: 06/22/2017 Document Reviewed: 06/22/2017 Elsevier Interactive Patient Education  2019 [employer].  Breast Pumping Tips There may be times when you cannot feed your baby from your breast, such as when you are at work or on a trip. Breast pumping allows you to remove milk from your breast in order to store for later use. There are three ways to pump. You can use:  Your hand to massage and squeeze your breast (hand expression).  A handheld manual pump.  An electric pump. When you first start to pump, you may not get much milk, but after a few days your breasts should start to make more. Pumping can help stimulate your milk supply after your baby is born. It can also help maintain your milk supply when you are away from your baby. When should I pump? You can start pumping soon after your baby is born. Here are some tips on when to pump:  When with your baby: ? Pump after breastfeeding. ? Pump from the free breast while you breastfeed.  When away from your baby: ? Pump every 2-3 hours for about 15 minutes. ? Pump both breasts at the same time if you can.  If your baby gets formula feeding, pump around the time your baby gets that feeding.  If you drank alcohol, wait 2 hours before pumping.  If you are having a procedure with anesthesia, talk to your health care provider about when you should pump before and after. How do I prepare to pump? Take steps to relax. This makes it easier to stimulate your let-down reflex, which is what makes breast milk flow. To  help:  Smell one of your infant's blankets or an item of clothing.  Look at a picture or video of your infant.  Sit in a quiet, private space.  Massage your breast and nipple.  Place a warm cloth on your breast. The cloth should be a little wet.  Play relaxing music.  Picture your milk flowing. What are some tips? General tips for pumping breast milk  Always wash your hands before pumping.  If you are not getting very much milk or pumping is uncomfortable, make adjustments to your pump or try using different type of pumps.  Drink enough fluid to keep your urine clear or pale yellow.  Wear clothing that opens in the front or allows easy access to your breasts.  Pump breast milk directly into clean bottles or other storage containers.  Do not use any products that contain nicotine or tobacco, such as cigarettes and e-cigarettes. These can lower your milk supply and harm your infant. If you need help quitting, ask your health care provider. Tips for storing breast milk  Store breast milk in a clean, BPA-free container, such as glass or plastic bottles or milk storage bags.  Store breast milk in 2-4 ounce batches to reduce waste.  Swirl the breast milk in the container to mix any cream that floats to the top. Do not shake it.  Label all stored milk with the date you pumped it.  The amount of time you can keep breast milk depends on where it is stored: ? Room temperature: 6-8 hours, if the milk is clean. It is best if used within 4 hours. ? Cooler with ice packs: 24 hours. ? Refrigerator: 5-8 days, if the milk is clean. It is best if used within 3 days. ? Freezer: 9-12 months, if the milk is clean and stored away from the freezer door. It is best if used within 6 months.  When using a refrigerator or freezer, put the milk in the back to keep it as cold as possible.  Thaw frozen milk using warm water. Do not use the microwave. Tips for choosing a breast pump The right pump  for you will depend on your comfort and how often you will be away from your baby. When choosing a pump, consider the following:  Manual breast pumps do not need electricity to work. They are usually cheaper than electric pumps, but they can be harder to use. They may be a good choice if you are occasionally away from your baby.  Electric breast pumps are usually more expensive than manual pumps, but they can be easier for some women to use. They can also collect more milk than manual pumps. This makes them a good choice for women who work in an office or need to be away from their baby for longer periods of time.  The suction cup (flange) should be the right size. If it is the wrong size, it may cause pain and nipple damage.  Before buying a pump, find out whether your insurance covers the cost of a breast pump. Tips for maintaining a breast pump  Check your pump's manual for cleaning tips.  Clean the pump after each use. To do this: ? Wipe down the electrical unit. Use a dry, soft cloth or clean paper towel. Do not put the electrical unit in water or cleaning products. ? Wash the plastic pump parts with soap and warm water or in the dishwasher, if the parts are dishwasher safe. You do not need to clean the tubing unless it comes in contact with breast milk. Let the parts air dry. Avoid drying them with a cloth or towel. ? When the pump parts are clean and dry, put the pump back together. Then store the pump.  If there is water in the tubing when it comes time  to pump, attach the tubing to the pump and turn on the pump. Run the pump until the tube is dry.  Avoid touching the inside of pump parts that come in contact with breast milk. Summary  Pumping can help stimulate your milk supply after your baby is born. It can also help maintain your milk supply when you are away from your baby.  When you are away from your infant for several hours, pump for about 15 minutes every 2-3 hours. Pump  both breasts at the same time, if you can.  Your health care provider or lactation consultant can help you decide which breast pump is right for you. The right pump for you depends on your comfort, work schedule, and how often you may be away from your baby. This information is not intended to replace advice given to you by your health care provider. Make sure you discuss any questions you have with your health care provider. Document Released: 05/04/2010 Document Revised: 08/03/2018 Document Reviewed: 12/19/2016 Elsevier Interactive Patient Education  2019 [employer] Inc[employer]   Preeclampsia and Eclampsia  Preeclampsia is a serious condition that may develop during pregnancy. It is also called toxemia of pregnancy. This condition causes high blood pressure along with other symptoms, such as swelling and headaches. These symptoms may develop as the condition gets worse. Preeclampsia may occur at 20 weeks of pregnancy or later. Diagnosing and treating preeclampsia early is very important. If not treated early, it can cause serious problems for you and your baby. One problem it can lead to is eclampsia. Eclampsia is a condition that causes muscle jerking or shaking (convulsions or seizures) and other serious problems for the mother. During pregnancy, delivering your baby may be the best treatment for preeclampsia or eclampsia. For most women, preeclampsia and eclampsia symptoms go away after giving birth. In rare cases, a woman may develop preeclampsia after giving birth (postpartum preeclampsia). This usually occurs within 48 hours after childbirth but may occur up to 6 weeks after giving birth. What are the causes? The cause of preeclampsia is not known. What increases the risk? The following risk factors make you more likely to develop preeclampsia:  Being pregnant for the first time.  Having had preeclampsia during a past pregnancy.  Having a family history of preeclampsia.  Having high blood  pressure.  Being pregnant with more than one baby.  Being 66 or older.  Being African-American.  Having kidney disease or diabetes.  Having medical conditions such as lupus or blood diseases.  Being very overweight (obese). What are the signs or symptoms? The earliest signs of preeclampsia are:  High blood pressure.  Increased protein in your urine. Your health care provider will check for this at every visit before you give birth (prenatal visit). Other symptoms that may develop as the condition gets worse include:  Severe headaches.  Sudden weight gain.  Swelling of the hands, face, legs, and feet.  Nausea and vomiting.  Vision problems, such as blurred or double vision.  Numbness in the face, arms, legs, and feet.  Urinating less than usual.  Dizziness.  Slurred speech.  Abdominal pain, especially upper abdominal pain.  Convulsions or seizures. How is this diagnosed? There are no screening tests for preeclampsia. Your health care provider will ask you about symptoms and check for signs of preeclampsia during your prenatal visits. You may also have tests that include:  Urine tests.  Blood tests.  Checking your blood pressure.  Monitoring your babys heart rate.  Ultrasound. How is this treated? You and your health care provider will determine the treatment approach that is best for you. Treatment may include:  Having more frequent prenatal exams to check for signs of preeclampsia, if you have an increased risk for preeclampsia.  Medicine to lower your blood pressure.  Staying in the hospital, if your condition is severe. There, treatment will focus on controlling your blood pressure and the amount of fluids in your body (fluid retention).  Taking medicine (magnesium sulfate) to prevent seizures. This may be given as an injection or through an IV.  Taking a low-dose aspirin during your pregnancy.  Delivering your baby early, if your condition gets  worse. You may have your labor started with medicine (induced), or you may have a cesarean delivery. Follow these instructions at home: Eating and drinking   Drink enough fluid to keep your urine pale yellow.  Avoid caffeine. Lifestyle  Do not use any products that contain nicotine or tobacco, such as cigarettes and e-cigarettes. If you need help quitting, ask your health care provider.  Do not use alcohol or drugs.  Avoid stress as much as possible. Rest and get plenty of sleep. General instructions  Take over-the-counter and prescription medicines only as told by your health care provider.  When lying down, lie on your left side. This keeps pressure off your major blood vessels.  When sitting or lying down, raise (elevate) your feet. Try putting some pillows underneath your lower legs.  Exercise regularly. Ask your health care provider what kinds of exercise are best for you.  Keep all follow-up and prenatal visits as told by your health care provider. This is important. How is this prevented? There is no known way of preventing preeclampsia or eclampsia from developing. However, to lower your risk of complications and detect problems early:  Get regular prenatal care. Your health care provider may be able to diagnose and treat the condition early.  Maintain a healthy weight. Ask your health care provider for help managing weight gain during pregnancy.  Work with your health care provider to manage any long-term (chronic) health conditions you have, such as diabetes or kidney problems.  You may have tests of your blood pressure and kidney function after giving birth.  Your health care provider may have you take low-dose aspirin during your next pregnancy. Contact a health care provider if:  You have symptoms that your health care provider told you may require more treatment or monitoring, such as: ? Headaches. ? Nausea or vomiting. ? Abdominal  pain. ? Dizziness. ? Light-headedness. Get help right away if:  You have severe: ? Abdominal pain. ? Headaches that do not get better. ? Dizziness. ? Vision problems. ? Confusion. ? Nausea or vomiting.  You have any of the following: ? A seizure. ? Sudden, rapid weight gain. ? Sudden swelling in your hands, ankles, or face. ? Trouble moving any part of your body. ? Numbness in any part of your body. ? Trouble speaking. ? Abnormal bleeding.  You faint. Summary  Preeclampsia is a serious condition that may develop during pregnancy. It is also called toxemia of pregnancy.  This condition causes high blood pressure along with other symptoms, such as swelling and headaches.  Diagnosing and treating preeclampsia early is very important. If not treated early, it can cause serious problems for you and your baby.  Get help right away if you have symptoms that your health care provider told you to watch for. This information is  not intended to replace advice given to you by your health care provider. Make sure you discuss any questions you have with your health care provider. Document Released: 11/11/2000 Document Revised: 10/31/2017 Document Reviewed: 06/20/2016 Elsevier Interactive Patient Education  2019 [employer].

## 2019-02-07 NOTE — Progress Notes (Signed)
Teaching complete  Pt instructed to notify MD OF any  Signs of elevated b/p

## 2019-02-07 NOTE — Discharge Summary (Signed)
Obstetric Discharge Summary Reason for Admission: onset of labor and PIH/preeclampsia Prenatal Procedures: ultrasound and labs Intrapartum Procedures: spontaneous vaginal delivery, GBS prophylaxis and epidural Postpartum Procedures: PP magnesium sulfate prophylaxis Complications-Operative and Postpartum: 2nd degree perineal laceration Hemoglobin  Date Value Ref Range Status  02/06/2019 8.4 (L) 12.0 - 15.0 g/dL Final   HCT  Date Value Ref Range Status  02/06/2019 26.7 (L) 36.0 - 46.0 % Final    Physical Exam:  General: alert, cooperative and no distress Lochia: appropriate Uterine Fundus: firm Incision: healing well DVT Evaluation: No evidence of DVT seen on physical exam.  Discharge Diagnoses: Term Pregnancy-delivered, Preelampsia and anemia  Discharge Information: Date: 02/07/2019 Activity: pelvic rest Diet: routine Medications: PNV, Ibuprofen, Iron and mag oxide and Fiorcet(PRN headache) Condition: stable Instructions: refer to practice specific booklet Discharge to: home Follow-up Information    Maxie Better, MD. Schedule an appointment as soon as possible for a visit in 1 week(s).   Specialty:  Obstetrics and Gynecology Why:  blood pressure recheck Contact information: [address] [place] [facility] [address] [phone]           Newborn Data: Live born female  Birth Weight: 7 lb 12.3 oz (3525 g) APGAR: 9, 9  Newborn Delivery   Birth date/time:  02/05/2019 02:39:00 Delivery type:  Vaginal, Spontaneous     Home with mother.  Bailey Schwartz 02/07/2019, 4:19 PM

## 2019-12-31 ENCOUNTER — Ambulatory Visit (INDEPENDENT_AMBULATORY_CARE_PROVIDER_SITE_OTHER): Payer: Managed Care, Other (non HMO) | Admitting: Osteopathic Medicine

## 2019-12-31 ENCOUNTER — Encounter: Payer: Self-pay | Admitting: Osteopathic Medicine

## 2019-12-31 ENCOUNTER — Other Ambulatory Visit: Payer: Self-pay

## 2019-12-31 VITALS — BP 126/82 | HR 90 | Temp 98.1°F | Ht 63.0 in | Wt 244.1 lb

## 2019-12-31 DIAGNOSIS — Z8669 Personal history of other diseases of the nervous system and sense organs: Secondary | ICD-10-CM

## 2019-12-31 DIAGNOSIS — R635 Abnormal weight gain: Secondary | ICD-10-CM

## 2019-12-31 DIAGNOSIS — Z8759 Personal history of other complications of pregnancy, childbirth and the puerperium: Secondary | ICD-10-CM | POA: Insufficient documentation

## 2019-12-31 DIAGNOSIS — Z8632 Personal history of gestational diabetes: Secondary | ICD-10-CM | POA: Insufficient documentation

## 2019-12-31 DIAGNOSIS — F419 Anxiety disorder, unspecified: Secondary | ICD-10-CM | POA: Insufficient documentation

## 2019-12-31 HISTORY — DX: Personal history of other diseases of the nervous system and sense organs: Z86.69

## 2019-12-31 HISTORY — DX: Abnormal weight gain: R63.5

## 2019-12-31 HISTORY — DX: Personal history of other complications of pregnancy, childbirth and the puerperium: Z87.59

## 2019-12-31 HISTORY — DX: Anxiety disorder, unspecified: F41.9

## 2019-12-31 HISTORY — DX: Personal history of gestational diabetes: Z86.32

## 2019-12-31 MED ORDER — SUMATRIPTAN SUCCINATE 50 MG PO TABS: 50.0000 mg | ORAL_TABLET | Freq: Once | ORAL | 2 refills | Status: DC

## 2019-12-31 MED ORDER — BUPROPION HCL ER (XL) 150 MG PO TB24: 150.0000 mg | ORAL_TABLET | ORAL | 0 refills | Status: DC

## 2019-12-31 NOTE — Progress Notes (Signed)
Bailey Schwartz is a 34 y.o. female who presents to  [place] at [facility]  today, 12/31/19, seeking care for the following:  The primary encounter diagnosis was Anxiety. Diagnoses of Abnormal weight gain, History of migraine, History of gestational diabetes, and History of pre-eclampsia were also pertinent to this visit.     ASSESSMENT & PLAN with other pertinent history/findings:   1. Anxiety Higher priority today, increased irritability and stress. Previous treatment for ADHD, Depression - no bad reactions to meds but nothing particularly helpful   2. Abnormal weight gain Wellbutrin may help Contrave or Qsymia would be good option given mental health and migraine comorbids   3. History of migraine As-needed Rx sent, consider ppx  Advised OCP not advised but infrequent migraines now, no apparent association w/ menstrual cycle.  Advised migraine diary / app  4. History of gestational diabetes A1C ordered  5. History of pre-eclampsia BP ok  Today, monitor      Orders Placed This Encounter  Procedures  . CBC  . COMPLETE METABOLIC PANEL WITH GFR  . Lipid panel  . TSH  . Hemoglobin A1c    Meds ordered this encounter  Medications  . buPROPion (WELLBUTRIN XL) 150 MG 24 hr tablet    Sig: Take 1 tablet (150 mg total) by mouth every morning.    Dispense:  90 tablet    Refill:  0  . SUMAtriptan (IMITREX) 50 MG tablet    Sig: Take 1 tablet (50 mg total) by mouth once for 1 dose. May repeat in 2 hours if headache persists or recurs. Max 150 mg in 24 hours, 5 days per month    Dispense:  10 tablet    Refill:  2       Follow-up instructions: Return in about 4 weeks (around 01/28/2020) for VIRTUAL VISIT, RECHECK ON NEW MEDICATION ([place], MIGRAINES, WEIGHT).                                BP 126/82 (BP Location: Left Arm, Patient Position: Sitting, Cuff Size: Large)   Pulse 90   Temp 98.1  F (36.7 C) (Oral)   Ht 5\' 3"  (1.6 m)   Wt 244 lb 1.9 oz (110.7 kg)   LMP 12/17/2019   BMI 43.24 kg/m   No outpatient medications have been marked as taking for the 12/31/19 encounter (Office Visit) with Emeterio Reeve, DO.    No results found for this or any previous visit (from the past 72 hour(s)).  No results found.  Depression screen [facility] 2/9 12/31/2019 05/02/2018  Decreased Interest 2 0  Down, Depressed, Hopeless 2 0  PHQ - 2 Score 4 0  Altered sleeping 3 -  Tired, decreased energy 3 -  Change in appetite 3 -  Feeling bad or failure about yourself  2 -  Trouble concentrating 1 -  Moving slowly or fidgety/restless 0 -  Suicidal thoughts 0 -  PHQ-9 Score 16 -  Difficult doing work/chores Very difficult -    GAD 7 : Generalized Anxiety Score 12/31/2019  Nervous, Anxious, on Edge 3  Control/stop worrying 2  Worry too much - different things 3  Trouble relaxing 2  Restless 2  Easily annoyed or irritable 3  Afraid - awful might happen 2  Total GAD 7 Score 17  Anxiety Difficulty Very difficult      All questions at time of visit  were answered - patient instructed to contact office with any additional concerns or updates.  ER/RTC precautions were reviewed with the patient.  Please note: voice recognition software was used to produce this document, and typos may escape review. Please contact Dr. Lyn Hollingshead for any needed clarifications.

## 2020-01-01 LAB — CBC
HCT: 41.7 % (ref 35.0–45.0)
Hemoglobin: 14.1 g/dL (ref 11.7–15.5)
MCH: 29.3 pg (ref 27.0–33.0)
MCHC: 33.8 g/dL (ref 32.0–36.0)
MCV: 86.7 fL (ref 80.0–100.0)
MPV: 10.7 fL (ref 7.5–12.5)
Platelets: 318 10*3/uL (ref 140–400)
RBC: 4.81 10*6/uL (ref 3.80–5.10)
RDW: 12.7 % (ref 11.0–15.0)
WBC: 9.4 10*3/uL (ref 3.8–10.8)

## 2020-01-01 LAB — COMPLETE METABOLIC PANEL WITH GFR
AG Ratio: 1.2 (calc) (ref 1.0–2.5)
ALT: 14 U/L (ref 6–29)
AST: 14 U/L (ref 10–30)
Albumin: 4.1 g/dL (ref 3.6–5.1)
Alkaline phosphatase (APISO): 83 U/L (ref 31–125)
BUN: 9 mg/dL (ref 7–25)
CO2: 26 mmol/L (ref 20–32)
Calcium: 9 mg/dL (ref 8.6–10.2)
Chloride: 104 mmol/L (ref 98–110)
Creat: 0.73 mg/dL (ref 0.50–1.10)
GFR, Est African American: 126 mL/min/{1.73_m2} (ref >=60)
GFR, Est Non African American: 109 mL/min/{1.73_m2} (ref >=60)
Globulin: 3.4 g/dL (calc) (ref 1.9–3.7)
Glucose, Bld: 77 mg/dL (ref 65–99)
Potassium: 4.3 mmol/L (ref 3.5–5.3)
Sodium: 139 mmol/L (ref 135–146)
Total Bilirubin: 0.2 mg/dL (ref 0.2–1.2)
Total Protein: 7.5 g/dL (ref 6.1–8.1)

## 2020-01-01 LAB — LIPID PANEL
Cholesterol: 154 mg/dL (ref <200)
HDL: 59 mg/dL (ref >=50)
LDL Cholesterol (Calc): 81 mg/dL (calc)
Non-HDL Cholesterol (Calc): 95 mg/dL (calc) (ref <130)
Total CHOL/HDL Ratio: 2.6 (calc) (ref <5.0)
Triglycerides: 60 mg/dL (ref <150)

## 2020-01-01 LAB — HEMOGLOBIN A1C
Hgb A1c MFr Bld: 5.6 % of total Hgb (ref <5.7)
Mean Plasma Glucose: 114 (calc)
eAG (mmol/L): 6.3 (calc)

## 2020-01-01 LAB — TSH: TSH: 1.85 mIU/L

## 2020-01-23 ENCOUNTER — Encounter: Payer: Self-pay | Admitting: Osteopathic Medicine

## 2020-03-02 ENCOUNTER — Encounter: Payer: Self-pay | Admitting: Osteopathic Medicine

## 2020-03-14 ENCOUNTER — Encounter: Payer: Self-pay | Admitting: Osteopathic Medicine

## 2020-03-16 NOTE — Telephone Encounter (Signed)
Left patient a voicemail with information to call us back to make an appointment with PCP in regards to medication concerns.

## 2020-03-16 NOTE — Telephone Encounter (Signed)
Sent msg to front to schedule.. can someone please follow up on this? No documentation where patient was called........Marland Kitchen

## 2020-03-23 ENCOUNTER — Other Ambulatory Visit: Payer: Self-pay | Admitting: Osteopathic Medicine

## 2020-03-25 ENCOUNTER — Encounter: Payer: Self-pay | Admitting: Osteopathic Medicine

## 2020-03-25 ENCOUNTER — Telehealth (INDEPENDENT_AMBULATORY_CARE_PROVIDER_SITE_OTHER): Payer: Managed Care, Other (non HMO) | Admitting: Osteopathic Medicine

## 2020-03-25 VITALS — Wt 242.0 lb

## 2020-03-25 DIAGNOSIS — R635 Abnormal weight gain: Secondary | ICD-10-CM

## 2020-03-25 DIAGNOSIS — F419 Anxiety disorder, unspecified: Secondary | ICD-10-CM

## 2020-03-25 DIAGNOSIS — Z87898 Personal history of other specified conditions: Secondary | ICD-10-CM

## 2020-03-25 DIAGNOSIS — Z8632 Personal history of gestational diabetes: Secondary | ICD-10-CM | POA: Diagnosis not present

## 2020-03-25 MED ORDER — SERTRALINE HCL 25 MG PO TABS: 25.0000 mg | ORAL_TABLET | Freq: Every day | ORAL | 0 refills | Status: DC

## 2020-03-25 NOTE — Progress Notes (Signed)
Called pt at 1052 am, no answer. Left a vm msg.

## 2020-03-25 NOTE — Progress Notes (Signed)
Bailey Schwartz is a 34 y.o. female who presents to  [place] at [facility]  today, 03/25/20, seeking care for the following:  The primary encounter diagnosis was Anxiety. A diagnosis of History of gestational diabetes was also pertinent to this visit.     ASSESSMENT & PLAN with other pertinent history/findings:  1. Anxiety Increased irritability and stress. Previous treatment for ADHD, Depression - no bad reactions to meds but nothing particularly helpful. We started Wellbutrin last visit. Helped initially but last few weeks seem to be anxious/down/irritable --> today will trial adding Zoloft, thinking of trying for pregnancy later this year   2. Abnormal weight gain Wellbutrin has helped, has lost 13 lbs Contrave or Qsymia would be good option given mental health and migraine comorbids   3. History of migraine As-needed Rx sent last visit w/ Imitrex, consider ppx. Has taken the Imitrex once in that time, it didn't help.  Advised OCP not ideal but infrequent migraines now, no apparent association w/ menstrual cycle.  Advised migraine diary / app to continue   4. History of gestational diabetes A1C ordered last vsit was was ok at 5.6  5. History of pre-eclampsia BP ok will, monitor    No orders of the defined types were placed in this encounter.   Meds ordered this encounter  Medications  . sertraline (ZOLOFT) 25 MG tablet    Sig: Take 1 tablet (25 mg total) by mouth daily.    Dispense:  90 tablet    Refill:  0       Follow-up instructions: No follow-ups on file.                                Wt 242 lb (109.8 kg)   BMI 42.87 kg/m   Current Meds  Medication Sig  . buPROPion (WELLBUTRIN XL) 150 MG 24 hr tablet Take 1 tablet (150 mg total) by mouth daily. Needs appt  . Butalbital-APAP-Caffeine 50-325-40 MG capsule Take 1 capsule by mouth every 6 (six) hours as needed for headache.   .  SUMAtriptan (IMITREX) 50 MG tablet Take 1 tablet (50 mg total) by mouth once for 1 dose. May repeat in 2 hours if headache persists or recurs. Max 150 mg in 24 hours, 5 days per month    No results found for this or any previous visit (from the past 72 hour(s)).  No results found.  Depression screen [facility] 2/9 03/25/2020 12/31/2019 05/02/2018  Decreased Interest 0 2 0  Down, Depressed, Hopeless 1 2 0  PHQ - 2 Score 1 4 0  Altered sleeping 0 3 -  Tired, decreased energy 1 3 -  Change in appetite 1 3 -  Feeling bad or failure about yourself  0 2 -  Trouble concentrating 0 1 -  Moving slowly or fidgety/restless 0 0 -  Suicidal thoughts 0 0 -  PHQ-9 Score 3 16 -  Difficult doing work/chores Somewhat difficult Very difficult -    GAD 7 : Generalized Anxiety Score 03/25/2020 12/31/2019  Nervous, Anxious, on Edge 3 3  Control/stop worrying 1 2  Worry too much - different things 0 3  Trouble relaxing 1 2  Restless 0 2  Easily annoyed or irritable 3 3  Afraid - awful might happen 1 2  Total GAD 7 Score 9 17  Anxiety Difficulty Somewhat difficult Very difficult      All questions  at time of visit were answered - patient instructed to contact office with any additional concerns or updates.  ER/RTC precautions were reviewed with the patient.  Please note: voice recognition software was used to produce this document, and typos may escape review. Please contact Dr. Lyn Hollingshead for any needed clarifications.

## 2020-04-23 ENCOUNTER — Encounter: Payer: Self-pay | Admitting: Osteopathic Medicine

## 2020-04-24 ENCOUNTER — Other Ambulatory Visit: Payer: Self-pay | Admitting: Osteopathic Medicine

## 2020-04-28 NOTE — Telephone Encounter (Signed)
Pt was scheduled for a virtual

## 2020-04-29 ENCOUNTER — Telehealth (INDEPENDENT_AMBULATORY_CARE_PROVIDER_SITE_OTHER): Payer: Managed Care, Other (non HMO) | Admitting: Osteopathic Medicine

## 2020-04-29 ENCOUNTER — Encounter: Payer: Self-pay | Admitting: Osteopathic Medicine

## 2020-04-29 VITALS — Wt 232.0 lb

## 2020-04-29 DIAGNOSIS — F419 Anxiety disorder, unspecified: Secondary | ICD-10-CM

## 2020-04-29 NOTE — Progress Notes (Signed)
Virtual Visit via Video (App used: MyChart) Note  I connected with      Bailey Schwartz on 04/29/20 at 3:07 PM  by a telemedicine application and verified that I am speaking with the correct person using two identifiers.  Patient is at home I am in office   I discussed the limitations of evaluation and management by telemedicine and the availability of in person appointments. The patient expressed understanding and agreed to proceed.  History of Present Illness: Bailey Schwartz is a 34 y.o. female who would like to discuss follow up mental health.    We had started Zoloft about a month ago, patient had already been taking Wellbutrin which we started about 4 months ago (history of ADD and concerned about weight issues), patient reports mood overall improved but anxiety has gotten worse.  Patient has decided to hold off on trying for pregnancy for at least another year.   Wt Readings from Last 3 Encounters:  04/29/20 232 lb (105.2 kg)  03/25/20 242 lb (109.8 kg)  12/31/19 244 lb 1.9 oz (110.7 kg)     Observations/Objective: Wt 232 lb (105.2 kg)   LMP 04/15/2020   BMI 41.10 kg/m  BP Readings from Last 3 Encounters:  12/31/19 126/82  02/07/19 (!) 148/87  05/02/18 120/78   Exam: Normal Speech.  NAD  Lab and Radiology Results No results found for this or any previous visit (from the past 72 hour(s)). No results found.     Assessment and Plan: 34 y.o. female with The encounter diagnosis was Anxiety.  We will trial holding the Wellbutrin and staying on the Zoloft for now.  Some concern that the activating nature of Wellbutrin may have caused some worsening anxiety.  We had initially started this to help with ADD symptoms/weight gain, patient has been successful with weight loss over the past few months.  Will see how mental health fares over the next week or so off of the Wellbutrin, and go from there.  MyChart reminder set for patient to reply to me middle of next week,  patient was advised let me know sooner than that if any questions/concerns or other symptoms come up.   Instructions sent via MyChart. If MyChart not available, pt was given option for info via personal e-mail w/ no guarantee of protected health info over unsecured e-mail communication, and MyChart sign-up instructions were sent to patient.   Follow Up Instructions: Return for MyChart emssage set to send 05/05/20 - see that note .    I discussed the assessment and treatment plan with the patient. The patient was provided an opportunity to ask questions and all were answered. The patient agreed with the plan and demonstrated an understanding of the instructions.   The patient was advised to call back or seek an in-person evaluation if any new concerns, if symptoms worsen or if the condition fails to improve as anticipated.  20 minutes of non-face-to-face time was provided during this encounter.      . . . . . . . . . . . . . Marland Kitchen                   Historical information moved to improve visibility of documentation.  Past Medical History:  Diagnosis Date  . Depression   . Genital warts   . Gestational diabetes   . History of abnormal cervical Pap smear    Hx of HPV  . History of chicken pox   . Migraine   .  Preeclampsia    Past Surgical History:  Procedure Laterality Date  . REFRACTIVE SURGERY     Social History   Tobacco Use  . Smoking status: Former Smoker    Packs/day: 0.18    Types: Cigarettes  . Smokeless tobacco: Never Used  Substance Use Topics  . Alcohol use: Yes    Comment: rare   family history includes Alcohol abuse in her maternal grandfather; Diabetes in her mother; Esophageal cancer in her maternal grandmother; Hyperlipidemia in her maternal grandmother, paternal grandfather, and paternal grandmother; Hypertension in her maternal grandmother, mother, paternal grandfather, and paternal grandmother; Liver cancer in her paternal  grandfather; Miscarriages / [place] in her mother; Stroke in her paternal grandfather.  Medications: Current Outpatient Medications  Medication Sig Dispense Refill  . buPROPion (WELLBUTRIN XL) 150 MG 24 hr tablet Take 1 tablet (150 mg total) by mouth daily. **PATIENT NEEDS OFFICE VISIT FOR ADDITIONAL REFILLS** 15 tablet 0  . sertraline (ZOLOFT) 25 MG tablet Take 1 tablet (25 mg total) by mouth daily. 90 tablet 0  . Butalbital-APAP-Caffeine 50-325-40 MG capsule Take 1 capsule by mouth every 6 (six) hours as needed for headache.     . ibuprofen (ADVIL,MOTRIN) 600 MG tablet Take 1 tablet (600 mg total) by mouth every 6 (six) hours. (Patient not taking: Reported on 12/31/2019) 30 tablet 0  . iron polysaccharides (NIFEREX) 150 MG capsule Take 1 capsule (150 mg total) by mouth daily. (Patient not taking: Reported on 12/31/2019) 30 capsule 0  . magnesium oxide (MAG-OX) 400 (241.3 Mg) MG tablet Take 1 tablet (400 mg total) by mouth daily. (Patient not taking: Reported on 12/31/2019) 45 tablet 0  . Prenatal Vit-Fe Fumarate-FA (PRENATAL MULTIVITAMIN) TABS tablet Take 1 tablet by mouth daily at 12 noon.    . SUMAtriptan (IMITREX) 50 MG tablet Take 1 tablet (50 mg total) by mouth once for 1 dose. May repeat in 2 hours if headache persists or recurs. Max 150 mg in 24 hours, 5 days per month 10 tablet 2   No current facility-administered medications for this visit.   Allergies  Allergen Reactions  . Fluconazole Hives  . Sulfamethoxazole-Trimethoprim Hives and Swelling    Angioedema

## 2020-06-19 ENCOUNTER — Other Ambulatory Visit: Payer: Self-pay | Admitting: Osteopathic Medicine

## 2020-06-23 ENCOUNTER — Encounter: Payer: Self-pay | Admitting: Osteopathic Medicine

## 2020-07-16 ENCOUNTER — Other Ambulatory Visit: Payer: Self-pay | Admitting: Osteopathic Medicine

## 2020-09-11 ENCOUNTER — Other Ambulatory Visit: Payer: Self-pay | Admitting: Osteopathic Medicine

## 2020-10-07 ENCOUNTER — Encounter: Payer: Self-pay | Admitting: Osteopathic Medicine

## 2020-11-10 ENCOUNTER — Other Ambulatory Visit: Payer: Self-pay

## 2020-11-10 ENCOUNTER — Ambulatory Visit (INDEPENDENT_AMBULATORY_CARE_PROVIDER_SITE_OTHER): Payer: Medicaid Other | Admitting: Physician Assistant

## 2020-11-10 ENCOUNTER — Ambulatory Visit (INDEPENDENT_AMBULATORY_CARE_PROVIDER_SITE_OTHER): Payer: Medicaid Other

## 2020-11-10 VITALS — BP 120/82 | HR 110 | Temp 98.7°F | Ht 63.0 in | Wt 219.0 lb

## 2020-11-10 DIAGNOSIS — R531 Weakness: Secondary | ICD-10-CM | POA: Diagnosis not present

## 2020-11-10 DIAGNOSIS — U071 COVID-19: Secondary | ICD-10-CM

## 2020-11-10 DIAGNOSIS — R059 Cough, unspecified: Secondary | ICD-10-CM | POA: Diagnosis not present

## 2020-11-10 NOTE — Patient Instructions (Signed)
ASA 81mg  a day.  Vitamin D 5000 units daily.  b12 1000units Vitamin C 500mg  daily

## 2020-11-10 NOTE — Progress Notes (Deleted)
Symptoms 10/30/2020 Tested positive for Covid 11/01/2020 Better except weakness/fatigue Weakness most noticeable when picking up toddler or taking a shower Wanted to be retested

## 2020-11-11 ENCOUNTER — Encounter: Payer: Self-pay | Admitting: Physician Assistant

## 2020-11-11 ENCOUNTER — Other Ambulatory Visit: Payer: Self-pay | Admitting: Neurology

## 2020-11-11 DIAGNOSIS — N289 Disorder of kidney and ureter, unspecified: Secondary | ICD-10-CM

## 2020-11-11 LAB — COMPLETE METABOLIC PANEL WITH GFR
AG Ratio: 1.5 (calc) (ref 1.0–2.5)
ALT: 123 U/L — ABNORMAL HIGH (ref 6–29)
AST: 51 U/L — ABNORMAL HIGH (ref 10–30)
Albumin: 4.3 g/dL (ref 3.6–5.1)
Alkaline phosphatase (APISO): 65 U/L (ref 31–125)
BUN: 11 mg/dL (ref 7–25)
CO2: 23 mmol/L (ref 20–32)
Calcium: 9.3 mg/dL (ref 8.6–10.2)
Chloride: 103 mmol/L (ref 98–110)
Creat: 0.74 mg/dL (ref 0.50–1.10)
GFR, Est African American: 124 mL/min/{1.73_m2} (ref >=60)
GFR, Est Non African American: 107 mL/min/{1.73_m2} (ref >=60)
Globulin: 2.9 g/dL (calc) (ref 1.9–3.7)
Glucose, Bld: 108 mg/dL — ABNORMAL HIGH (ref 65–99)
Potassium: 3.5 mmol/L (ref 3.5–5.3)
Sodium: 137 mmol/L (ref 135–146)
Total Bilirubin: 0.3 mg/dL (ref 0.2–1.2)
Total Protein: 7.2 g/dL (ref 6.1–8.1)

## 2020-11-11 LAB — CBC WITH DIFFERENTIAL/PLATELET
Absolute Monocytes: 988 cells/uL — ABNORMAL HIGH (ref 200–950)
Basophils Absolute: 26 cells/uL (ref 0–200)
Basophils Relative: 0.2 %
Eosinophils Absolute: 26 cells/uL (ref 15–500)
Eosinophils Relative: 0.2 %
HCT: 44.2 % (ref 35.0–45.0)
Hemoglobin: 15 g/dL (ref 11.7–15.5)
Lymphs Abs: 4199 cells/uL — ABNORMAL HIGH (ref 850–3900)
MCH: 28.6 pg (ref 27.0–33.0)
MCHC: 33.9 g/dL (ref 32.0–36.0)
MCV: 84.2 fL (ref 80.0–100.0)
MPV: 11.3 fL (ref 7.5–12.5)
Monocytes Relative: 7.6 %
Neutro Abs: 7761 cells/uL (ref 1500–7800)
Neutrophils Relative %: 59.7 %
Platelets: 358 10*3/uL (ref 140–400)
RBC: 5.25 10*6/uL — ABNORMAL HIGH (ref 3.80–5.10)
RDW: 12.8 % (ref 11.0–15.0)
Total Lymphocyte: 32.3 %
WBC: 13 10*3/uL — ABNORMAL HIGH (ref 3.8–10.8)

## 2020-11-11 LAB — TSH: TSH: 1.04 mIU/L

## 2020-11-11 NOTE — Progress Notes (Signed)
Subjective:    Patient ID: Bailey Schwartz, female    DOB: Feb 28, 1986, 34 y.o.   MRN: [id number]  HPI  Pt is a 34 yo obese female on Day 14 of confirmed covid infection. Tested positive on 11/01/2020. Her husband is still in hospital on 4L of O2. She does feel better but very tired and weak. She denies any swelling or problems lying flat. No CP, fever, chills, body aches, headaches. She just "wants to be checked out". She did lose her smell and taste. She does have a mildly productive cough.   .. Active Ambulatory Problems    Diagnosis Date Noted  . Depression, major, single episode, 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/02/2018  . 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 Postpartum care following vaginal delivery 3/10 02/05/2019  . Preeclampsia 02/05/2019  . Obstetrical laceration -  2nd degree Perineal; right vaginal Sulcus;Labial majus bilateral 02/05/2019  . Anxiety 12/31/2019  . Abnormal weight gain 12/31/2019  . History of migraine 12/31/2019  . History of gestational diabetes 12/31/2019  . History of pre-eclampsia 12/31/2019   Resolved Ambulatory Problems    Diagnosis Date Noted  . Indication for care in labor or delivery 02/04/2019   Past Medical History:  Diagnosis Date  . Depression   . Genital warts   . Gestational diabetes   . History of abnormal cervical Pap smear   . History of chicken pox   . Migraine       Review of Systems  All other systems reviewed and are negative.      Objective:   Physical Exam Vitals reviewed.  Constitutional:      Appearance: Normal appearance. She is obese.  HENT:     Head: Normocephalic.  Neck:     Vascular: No carotid bruit.  Cardiovascular:     Rate and Rhythm: Regular rhythm. Tachycardia present.     Pulses: Normal pulses.  Pulmonary:     Effort: Pulmonary effort is normal.     Breath sounds: Normal breath sounds. No wheezing or rhonchi.  Musculoskeletal:     Right lower leg: No edema.     Left lower leg: No  edema.     Comments: 5/5 upper and lower ext strength  Lymphadenopathy:     Cervical: No cervical adenopathy.  Neurological:     General: No focal deficit present.     Mental Status: She is alert and oriented to person, place, and time.  Psychiatric:        Mood and Affect: Mood normal.           Assessment & Plan:  .Marland KitchenRicke Hey was seen today for weakness.  Diagnoses and all orders for this visit:  COVID-19 virus infection -     COMPLETE METABOLIC PANEL WITH GFR -     TSH -     CBC with Differential/Platelet -     DG Chest 2 View  Cough -     COMPLETE METABOLIC PANEL WITH GFR -     TSH -     CBC with Differential/Platelet -     DG Chest 2 View  Weakness -     COMPLETE METABOLIC PANEL WITH GFR -     TSH -     CBC with Differential/Platelet -     DG Chest 2 View   HR elevated otherwise vitals stable.  Recheck CBC/CMP/TSH. CXR ordered.  Reassured patient that the acute phase can take 4-6 weeks for most people to get back to normal.  HO  given on longer symptoms and what to expect.  If any worsening SOB or swelling call office and we could get echo to check for any post -covid cardiomyopathy. No concerns signs on exam today.  Discussed vitamins to start, rest and hydration.

## 2020-11-11 NOTE — Progress Notes (Signed)
Bailey Schwartz,   Kidney looks perfect.  Your liver enzymes are elevated but we see that at times after covid and even other viruses as well.  At this point they are not dangerously elevated. I do want to follow them a little closer. Recheck in 2 weeks or sooner if you feel like you could be worsening. For now avoid any tylenol products and alcohol. You may take NSAIDs.  Thyroid normal.

## 2020-11-11 NOTE — Progress Notes (Signed)
Normal CXR

## 2020-11-12 ENCOUNTER — Other Ambulatory Visit: Payer: Self-pay | Admitting: Osteopathic Medicine

## 2020-11-19 ENCOUNTER — Other Ambulatory Visit: Payer: Self-pay

## 2020-11-19 ENCOUNTER — Telehealth (INDEPENDENT_AMBULATORY_CARE_PROVIDER_SITE_OTHER): Payer: Medicaid Other | Admitting: Osteopathic Medicine

## 2020-11-19 ENCOUNTER — Encounter: Payer: Self-pay | Admitting: Osteopathic Medicine

## 2020-11-19 VITALS — Wt 225.0 lb

## 2020-11-19 DIAGNOSIS — R41840 Attention and concentration deficit: Secondary | ICD-10-CM

## 2020-11-19 DIAGNOSIS — Z8659 Personal history of other mental and behavioral disorders: Secondary | ICD-10-CM | POA: Diagnosis not present

## 2020-11-19 DIAGNOSIS — F32A Depression, unspecified: Secondary | ICD-10-CM | POA: Diagnosis not present

## 2020-11-19 DIAGNOSIS — F419 Anxiety disorder, unspecified: Secondary | ICD-10-CM | POA: Diagnosis not present

## 2020-11-19 MED ORDER — FLUOXETINE HCL 10 MG PO CAPS: ORAL_CAPSULE | ORAL | 0 refills | Status: DC

## 2020-11-19 NOTE — Progress Notes (Signed)
Telemedicine Visit via  Video & Audio (App used: MyChart)  I connected with Bailey Schwartz on 11/19/20 at 8:44 AM  by phone or  telemedicine application as noted above  I verified that I am speaking with or regarding  the correct patient using two identifiers.  Participants: Myself, Dr Sunnie Nielsen DO Patient: Bailey Schwartz Patient proxy if applicable: none Other, if applicable: none  Patient is in separate location from myself  I am in office at [facility]    I discussed the limitations of evaluation and management  by telemedicine and the availability of in person appointments.  The participant(s) above expressed understanding and  agreed to proceed with this appointment via telemedicine.       History of Present Illness: Bailey Schwartz is a 34 y.o. female who would like to discuss mental health   Anxiety - started Wellbutrin 12/2019, added Zoloft 25 mg 03/2020. We decided to come off the Wellbutrin d/t concerns for increased anxiety. Stopping this helped, I had discussed option to increase Zoloft further. Looks like some messages in MyChart, pt was advised to schedule visit for this as well as migraine issues etc, visit was never scheduled by patient. She reports Zoloft helps anxiety but has reduced libido substantially. She reports previous evaluation for ADHD as a kid and was positive for ADHD Dx per her report but her parents never sought medication (language barrier between them and the medical provider at that time may have contributed). She think anxiety/overwhelmed feeling may be connected to attention deficit problems and would like to seek formal evaluation for this.      Observations/Objective: Wt 225 lb (102.1 kg)   BMI 39.86 kg/m  BP Readings from Last 3 Encounters:  11/10/20 120/82  12/31/19 126/82  02/07/19 (!) 148/87   Exam: Normal Speech.  NAD  Lab and Radiology Results No results found for this or any previous visit (from the past 72  hour(s)). No results found.     Assessment and Plan: 34 y.o. female with The primary encounter diagnosis was Concentration deficit. Diagnoses of History of ADHD and Anxiety and depression were also pertinent to this visit.   PDMP not reviewed this encounter. Orders Placed This Encounter  Procedures  . Ambulatory referral to Behavioral Health    Referral Priority:   Routine    Referral Type:   Psychiatric    Referral Reason:   Specialty Services Required    Requested Specialty:   Behavioral Health    Number of Visits Requested:   1   Meds ordered this encounter  Medications  . FLUoxetine (PROZAC) 10 MG capsule    Sig: Take 1 capsule (10 mg total) by mouth daily for 14 days, THEN 2 capsules (20 mg total) daily for 14 days.    Dispense:  42 capsule    Refill:  0      Follow Up Instructions: Return in about 4 weeks (around 12/17/2020) for RECHECK MENTAL HEALTH ON NEW MEDICATION.    I discussed the assessment and treatment plan with the patient. The patient was provided an opportunity to ask questions and all were answered. The patient agreed with the plan and demonstrated an understanding of the instructions.   The patient was advised to call back or seek an in-person evaluation if any new concerns, if symptoms worsen or if the condition fails to improve as anticipated.  30 minutes of non-face-to-face time was provided during this encounter.      . . . . . . . . . . . . . Marland Kitchen  Historical information moved to improve visibility of documentation.  Past Medical History:  Diagnosis Date  . Depression   . Genital warts   . Gestational diabetes   . History of abnormal cervical Pap smear    Hx of HPV  . History of chicken pox   . Migraine   . Preeclampsia    Past Surgical History:  Procedure Laterality Date  . REFRACTIVE SURGERY     Social History   Tobacco Use  . Smoking status: Former Smoker    Packs/day: 0.18     Types: Cigarettes  . Smokeless tobacco: Never Used  Substance Use Topics  . Alcohol use: Yes    Comment: rare   family history includes Alcohol abuse in her maternal grandfather; Diabetes in her mother; Esophageal cancer in her maternal grandmother; Hyperlipidemia in her maternal grandmother, paternal grandfather, and paternal grandmother; Hypertension in her maternal grandmother, mother, paternal grandfather, and paternal grandmother; Liver cancer in her paternal grandfather; Miscarriages / [place] in her mother; Stroke in her paternal grandfather.  Medications: Current Outpatient Medications  Medication Sig Dispense Refill  . Butalbital-APAP-Caffeine 50-325-40 MG capsule Take 1 capsule by mouth every 6 (six) hours as needed for headache.     . ibuprofen (ADVIL,MOTRIN) 600 MG tablet Take 1 tablet (600 mg total) by mouth every 6 (six) hours. 30 tablet 0  . JUNEL FE 1/20 1-20 MG-MCG tablet Take 1 tablet by mouth daily.    Marland Kitchen FLUoxetine (PROZAC) 10 MG capsule Take 1 capsule (10 mg total) by mouth daily for 14 days, THEN 2 capsules (20 mg total) daily for 14 days. 42 capsule 0  . Prenatal Vit-Fe Fumarate-FA (PRENATAL MULTIVITAMIN) TABS tablet Take 1 tablet by mouth daily at 12 noon. (Patient not taking: Reported on 11/19/2020)    . SUMAtriptan (IMITREX) 50 MG tablet TAKE 1 TABLET (50 MG TOTAL) BY MOUTH ONCE FOR 1 DOSE. MAY REPEAT IN 2 HOURS IF HEADACHE PERSISTS OR RECURS. MAX 150 MG IN 24 HOURS, 5 DAYS PER MONTH 9 tablet 2   No current facility-administered medications for this visit.   Allergies  Allergen Reactions  . Sulfamethoxazole-Trimethoprim Hives, Swelling and Rash    Angioedema Angioedema Angioedema  . Fluconazole Hives and Rash

## 2020-11-24 ENCOUNTER — Encounter: Payer: Self-pay | Admitting: Osteopathic Medicine

## 2020-11-25 ENCOUNTER — Other Ambulatory Visit: Payer: Self-pay

## 2020-11-25 MED ORDER — FLUOXETINE HCL 10 MG PO CAPS: ORAL_CAPSULE | ORAL | 0 refills | Status: DC

## 2020-11-27 ENCOUNTER — Encounter: Payer: Self-pay | Admitting: Osteopathic Medicine

## 2020-11-30 NOTE — Telephone Encounter (Signed)
Is have her schedule an appointment to come in for Toradol and Phenergan injection.

## 2020-12-01 ENCOUNTER — Ambulatory Visit (INDEPENDENT_AMBULATORY_CARE_PROVIDER_SITE_OTHER): Payer: Medicaid Other | Admitting: Family Medicine

## 2020-12-01 DIAGNOSIS — Z5329 Procedure and treatment not carried out because of patient's decision for other reasons: Secondary | ICD-10-CM

## 2020-12-01 NOTE — Progress Notes (Deleted)
Acute Office Visit  Subjective:    Patient ID: Bailey Schwartz, female    DOB: 11/17/1986, 35 y.o.   MRN: [id number]  No chief complaint on file.   HPI Patient is in today for ***  Past Medical History:  Diagnosis Date  . Depression   . Genital warts   . Gestational diabetes   . History of abnormal cervical Pap smear    Hx of HPV  . History of chicken pox   . Migraine   . Preeclampsia     Past Surgical History:  Procedure Laterality Date  . REFRACTIVE SURGERY      Family History  Problem Relation Age of Onset  . Miscarriages / [place] Mother   . Hypertension Mother   . Diabetes Mother   . Esophageal cancer Maternal Grandmother   . Hyperlipidemia Maternal Grandmother   . Hypertension Maternal Grandmother   . Alcohol abuse Maternal Grandfather   . Hyperlipidemia Paternal Grandmother   . Hypertension Paternal Grandmother   . Hyperlipidemia Paternal Grandfather   . Hypertension Paternal Grandfather   . Liver cancer Paternal Grandfather   . Stroke Paternal Grandfather     Social History   Socioeconomic History  . Marital status: Single    Spouse name: Not on file  . Number of children: 1  . Years of education: Not on file  . Highest education level: Not on file  Occupational History    Employer: [employer]  Tobacco Use  . Smoking status: Former Smoker    Packs/day: 0.18    Types: Cigarettes  . Smokeless tobacco: Never Used  Vaping Use  . Vaping Use: Never used  Substance and Sexual Activity  . Alcohol use: Yes    Comment: rare  . Drug use: Not Currently  . Sexual activity: Yes    Birth control/protection: None    Comment: last IC-yesterday   Other Topics Concern  . Not on file  Social History Narrative  . Not on file   Social Determinants of Health   Financial Resource Strain: Not on file  Food Insecurity: Not on file  Transportation Needs: Not on file  Physical Activity: Not on file  Stress: Not on file  Social  Connections: Not on file  Intimate Partner Violence: Not on file    Outpatient Medications Prior to Visit  Medication Sig Dispense Refill  . Butalbital-APAP-Caffeine 50-325-40 MG capsule Take 1 capsule by mouth every 6 (six) hours as needed for headache.     Marland Kitchen FLUoxetine (PROZAC) 10 MG capsule Take 1 capsule (10 mg total) by mouth daily for 14 days, THEN 2 capsules (20 mg total) daily for 14 days. 42 capsule 0  . ibuprofen (ADVIL,MOTRIN) 600 MG tablet Take 1 tablet (600 mg total) by mouth every 6 (six) hours. 30 tablet 0  . JUNEL FE 1/20 1-20 MG-MCG tablet Take 1 tablet by mouth daily.    . Prenatal Vit-Fe Fumarate-FA (PRENATAL MULTIVITAMIN) TABS tablet Take 1 tablet by mouth daily at 12 noon. (Patient not taking: Reported on 11/19/2020)    . SUMAtriptan (IMITREX) 50 MG tablet TAKE 1 TABLET (50 MG TOTAL) BY MOUTH ONCE FOR 1 DOSE. MAY REPEAT IN 2 HOURS IF HEADACHE PERSISTS OR RECURS. MAX 150 MG IN 24 HOURS, 5 DAYS PER MONTH 9 tablet 2   No facility-administered medications prior to visit.    Allergies  Allergen Reactions  . Sulfamethoxazole-Trimethoprim Hives, Swelling and Rash    Angioedema Angioedema Angioedema  . Fluconazole Hives  and Rash    Review of Systems     Objective:    Physical Exam  There were no vitals taken for this visit. Wt Readings from Last 3 Encounters:  11/19/20 225 lb (102.1 kg)  11/10/20 219 lb (99.3 kg)  04/29/20 232 lb (105.2 kg)    Health Maintenance Due  Topic Date Due  . Hepatitis C Screening  Never done  . HIV Screening  Never done  . PAP SMEAR-Modifier  Never done    There are no preventive care reminders to display for this patient.   Lab Results  Component Value Date   TSH 1.04 11/10/2020   Lab Results  Component Value Date   WBC 13.0 (H) 11/10/2020   HGB 15.0 11/10/2020   HCT 44.2 11/10/2020   MCV 84.2 11/10/2020   PLT 358 11/10/2020   Lab Results  Component Value Date   NA 137 11/10/2020   K 3.5 11/10/2020   CO2 23  11/10/2020   GLUCOSE 108 (H) 11/10/2020   BUN 11 11/10/2020   CREATININE 0.74 11/10/2020   BILITOT 0.3 11/10/2020   ALKPHOS 160 (H) 02/04/2019   AST 51 (H) 11/10/2020   ALT 123 (H) 11/10/2020   PROT 7.2 11/10/2020   ALBUMIN 2.6 (L) 02/04/2019   CALCIUM 9.3 11/10/2020   ANIONGAP 7 02/04/2019   Lab Results  Component Value Date   CHOL 154 12/31/2019   Lab Results  Component Value Date   HDL 59 12/31/2019   Lab Results  Component Value Date   LDLCALC 81 12/31/2019   Lab Results  Component Value Date   TRIG 60 12/31/2019   Lab Results  Component Value Date   CHOLHDL 2.6 12/31/2019   Lab Results  Component Value Date   HGBA1C 5.6 12/31/2019       Assessment & Plan:   Problem List Items Addressed This Visit   None      No orders of the defined types were placed in this encounter.    Nani Gasser, MD

## 2020-12-06 ENCOUNTER — Other Ambulatory Visit: Payer: Self-pay | Admitting: Osteopathic Medicine

## 2020-12-15 ENCOUNTER — Encounter: Payer: Self-pay | Admitting: Osteopathic Medicine

## 2020-12-15 NOTE — Telephone Encounter (Signed)
Please schedule appt for patient.

## 2020-12-16 NOTE — Telephone Encounter (Signed)
LVM for patient to call back to set up a virtual visit. AM

## 2020-12-27 ENCOUNTER — Other Ambulatory Visit: Payer: Self-pay | Admitting: Osteopathic Medicine

## 2020-12-28 NOTE — Telephone Encounter (Signed)
Pt/[facility] requesting med refill for fluoxetine. Pt taking daily. Updated rx required.

## 2021-02-19 ENCOUNTER — Encounter: Payer: Self-pay | Admitting: Osteopathic Medicine

## 2021-02-19 ENCOUNTER — Telehealth (INDEPENDENT_AMBULATORY_CARE_PROVIDER_SITE_OTHER): Payer: Medicaid Other | Admitting: Osteopathic Medicine

## 2021-02-19 DIAGNOSIS — F419 Anxiety disorder, unspecified: Secondary | ICD-10-CM

## 2021-02-19 DIAGNOSIS — F32A Depression, unspecified: Secondary | ICD-10-CM

## 2021-02-19 DIAGNOSIS — E6609 Other obesity due to excess calories: Secondary | ICD-10-CM | POA: Diagnosis not present

## 2021-02-19 MED ORDER — DULOXETINE HCL 20 MG PO CPEP: 20.0000 mg | ORAL_CAPSULE | Freq: Every day | ORAL | 1 refills | Status: DC

## 2021-02-19 NOTE — Progress Notes (Signed)
Telemedicine Visit via  Video & Audio (App used: MyChart)  I connected with Bailey Schwartz on 02/19/21 at 8:12 AM  by phone or  telemedicine application as noted above  I verified that I am speaking with or regarding  the correct patient using two identifiers.  Participants: Myself, Dr Sunnie Nielsen DO Patient: Bailey Schwartz Patient proxy if applicable: NONE Other, if applicable: NONE  Patient is at home I am in office at [facility]    I discussed the limitations of evaluation and management  by telemedicine and the availability of in person appointments.  The participant(s) above expressed understanding and  agreed to proceed with this appointment via telemedicine.       History of Present Illness: Bailey Schwartz is a 35 y.o. female who would like to discuss Anxiety, weight    . Started fluoxetine about 2 mos ago, previously on wellbutrin which caused higher anxiety, zoloft which caused decreased libido. The fluoxetine helps appetite but is causing more anxiety.  . Inquires about weight loss Rx optoins.      Observations/Objective: There were no vitals taken for this visit. BP Readings from Last 3 Encounters:  11/10/20 120/82  12/31/19 126/82  02/07/19 (!) 148/87   Exam: Normal Speech.  NAD  Lab and Radiology Results No results found for this or any previous visit (from the past 72 hour(s)). No results found.     Assessment and Plan: 35 y.o. female with The primary encounter diagnosis was Anxiety and depression. A diagnosis of Obesity due to excess calories without serious comorbidity, unspecified classification was also pertinent to this visit.  1. Anxiety and depression Trial SNRI  2. Obesity due to excess calories without serious comorbidity, unspecified classification Pt asked to look up Saxenda or Wegovy (would ont have her on Qsymia d/t stimulant in anxiety, would not use Contrace d/t did not tolerate Wellbutrin in the past)    PDMP  not reviewed this encounter. No orders of the defined types were placed in this encounter.  Meds ordered this encounter  Medications  . DULoxetine (CYMBALTA) 20 MG capsule    Sig: Take 1 capsule (20 mg total) by mouth daily.    Dispense:  30 capsule    Refill:  1   There are no Patient Instructions on file for this visit.  Instructions sent via MyChart.   Follow Up Instructions: Return in about 4 weeks (around 03/19/2021) for VIRTUAL VISIT RECHECK MENTAL HEALTH.    I discussed the assessment and treatment plan with the patient. The patient was provided an opportunity to ask questions and all were answered. The patient agreed with the plan and demonstrated an understanding of the instructions.   The patient was advised to call back or seek an in-person evaluation if any new concerns, if symptoms worsen or if the condition fails to improve as anticipated.  30 minutes of non-face-to-face time was provided during this encounter.      . . . . . . . . . . . . . Marland Kitchen                   Historical information moved to improve visibility of documentation.  Past Medical History:  Diagnosis Date  . Depression   . Genital warts   . Gestational diabetes   . History of abnormal cervical Pap smear    Hx of HPV  . History of chicken pox   . Migraine   . Preeclampsia    Past Surgical History:  Procedure Laterality Date  . REFRACTIVE SURGERY     Social History   Tobacco Use  . Smoking status: Former Smoker    Packs/day: 0.18    Types: Cigarettes  . Smokeless tobacco: Never Used  Substance Use Topics  . Alcohol use: Yes    Comment: rare   family history includes Alcohol abuse in her maternal grandfather; Diabetes in her mother; Esophageal cancer in her maternal grandmother; Hyperlipidemia in her maternal grandmother, paternal grandfather, and paternal grandmother; Hypertension in her maternal grandmother, mother, paternal grandfather, and paternal  grandmother; Liver cancer in her paternal grandfather; Miscarriages / [place] in her mother; Stroke in her paternal grandfather.  Medications: Current Outpatient Medications  Medication Sig Dispense Refill  . Butalbital-APAP-Caffeine 50-325-40 MG capsule Take 1 capsule by mouth every 6 (six) hours as needed for headache.     Marland Kitchen FLUoxetine (PROZAC) 20 MG capsule Take 1 capsule (20 mg total) by mouth daily. 90 capsule 3  . ibuprofen (ADVIL,MOTRIN) 600 MG tablet Take 1 tablet (600 mg total) by mouth every 6 (six) hours. 30 tablet 0  . JUNEL FE 1/20 1-20 MG-MCG tablet Take 1 tablet by mouth daily.    . Prenatal Vit-Fe Fumarate-FA (PRENATAL MULTIVITAMIN) TABS tablet Take 1 tablet by mouth daily at 12 noon. (Patient not taking: Reported on 11/19/2020)    . SUMAtriptan (IMITREX) 50 MG tablet TAKE 1 TABLET (50 MG TOTAL) BY MOUTH ONCE FOR 1 DOSE. MAY REPEAT IN 2 HOURS IF HEADACHE PERSISTS OR RECURS. MAX 150 MG IN 24 HOURS, 5 DAYS PER MONTH 9 tablet 2   No current facility-administered medications for this visit.   Allergies  Allergen Reactions  . Sulfamethoxazole-Trimethoprim Hives, Swelling and Rash    Angioedema Angioedema Angioedema  . Fluconazole Hives and Rash     If phone visit, billing and coding can please add appropriate modifier if needed

## 2021-03-20 ENCOUNTER — Encounter: Payer: Self-pay | Admitting: Osteopathic Medicine

## 2021-03-25 MED ORDER — DULOXETINE HCL 40 MG PO CPEP: 40.0000 mg | ORAL_CAPSULE | Freq: Every day | ORAL | 0 refills | Status: DC

## 2021-03-25 MED ORDER — DULOXETINE HCL 20 MG PO CPEP: 40.0000 mg | ORAL_CAPSULE | Freq: Every day | ORAL | 1 refills | Status: DC

## 2021-03-26 ENCOUNTER — Telehealth: Payer: Self-pay

## 2021-03-26 NOTE — Telephone Encounter (Signed)
Prior authorization for cymbalta 40 mg submitted to insurance. Pending a determination.

## 2021-04-15 ENCOUNTER — Encounter: Payer: Self-pay | Admitting: Osteopathic Medicine

## 2021-04-17 ENCOUNTER — Other Ambulatory Visit: Payer: Self-pay | Admitting: Osteopathic Medicine

## 2021-04-23 ENCOUNTER — Encounter: Payer: Self-pay | Admitting: Osteopathic Medicine

## 2021-04-23 ENCOUNTER — Telehealth (INDEPENDENT_AMBULATORY_CARE_PROVIDER_SITE_OTHER): Payer: Medicaid Other | Admitting: Osteopathic Medicine

## 2021-04-23 VITALS — Temp 98.2°F | Wt 222.0 lb

## 2021-04-23 DIAGNOSIS — F419 Anxiety disorder, unspecified: Secondary | ICD-10-CM

## 2021-04-23 DIAGNOSIS — F32A Depression, unspecified: Secondary | ICD-10-CM

## 2021-04-23 DIAGNOSIS — Z9114 Patient's other noncompliance with medication regimen: Secondary | ICD-10-CM | POA: Diagnosis not present

## 2021-04-23 MED ORDER — BUSPIRONE HCL 10 MG PO TABS: 5.0000 mg | ORAL_TABLET | Freq: Three times a day (TID) | ORAL | 1 refills | Status: DC

## 2021-04-23 NOTE — Progress Notes (Signed)
Telemedicine Visit via  Audio only - telephone (patient preference /  technical difficulty with MyChart video application)  I connected with Bailey Schwartz on 04/23/21 at 8:45 AM  by phone or  telemedicine application as noted above  I verified that I am speaking with or regarding  the correct patient using two identifiers.  Participants: Myself, Dr Sunnie Nielsen DO Patient: Bailey Schwartz Patient proxy if applicable: none Other, if applicable: none  Patient is at home I am in office at [facility]    I discussed the limitations of evaluation and management  by telemedicine and the availability of in person appointments.  The participant(s) above expressed understanding and  agreed to proceed with this appointment via telemedicine.       History of Present Illness: Bailey Schwartz is a 36 y.o. female who would like to discuss mental health f/u  Anxiousness wasn't too bad on the celexa higher dose, but was waking up every few hours with significant anxiety and has stopped taking it this past week, this sleeps issue has resolved      Observations/Objective: Temp 98.2 F (36.8 C)   Wt 222 lb (100.7 kg)   BMI 39.33 kg/m  BP Readings from Last 3 Encounters:  11/10/20 120/82  12/31/19 126/82  02/07/19 (!) 148/87   Exam: Normal Speech.  NAD  Lab and Radiology Results No results found for this or any previous visit (from the past 72 hour(s)). No results found.     Assessment and Plan: 36 y.o. female with The primary encounter diagnosis was Anxiety and depression. A diagnosis of Medication nonadherence due to intolerance was also pertinent to this visit.  --> restart lower dose SNRI --> add BuSpar for anxiety, can take prn or can take on a schedule, pt was instructed on optional variable dosing for this medication  --> pt will be getting better insurance son, will revisit psych referral and Genesight testing at that time  PDMP not reviewed this  encounter. No orders of the defined types were placed in this encounter.  Meds ordered this encounter  Medications  . busPIRone (BUSPAR) 10 MG tablet    Sig: Take 0.5-1.5 tablets (5-15 mg total) by mouth 3 (three) times daily.    Dispense:  90 tablet    Refill:  1   There are no Patient Instructions on file for this visit.  Instructions sent via MyChart.   Follow Up Instructions: Return for FOLLOW UP VIA MYCHART MESSAGE (SET TO SEND LATER).    I discussed the assessment and treatment plan with the patient. The patient was provided an opportunity to ask questions and all were answered. The patient agreed with the plan and demonstrated an understanding of the instructions.   The patient was advised to call back or seek an in-person evaluation if any new concerns, if symptoms worsen or if the condition fails to improve as anticipated.  21 minutes of non-face-to-face time was provided during this encounter.      . . . . . . . . . . . . . Marland Kitchen                   Historical information moved to improve visibility of documentation.  Past Medical History:  Diagnosis Date  . Depression   . Genital warts   . Gestational diabetes   . History of abnormal cervical Pap smear    Hx of HPV  . History of chicken pox   . Migraine   .  Preeclampsia    Past Surgical History:  Procedure Laterality Date  . REFRACTIVE SURGERY     Social History   Tobacco Use  . Smoking status: Former Smoker    Packs/day: 0.18    Types: Cigarettes  . Smokeless tobacco: Never Used  Substance Use Topics  . Alcohol use: Yes    Comment: rare   family history includes Alcohol abuse in her maternal grandfather; Diabetes in her mother; Esophageal cancer in her maternal grandmother; Hyperlipidemia in her maternal grandmother, paternal grandfather, and paternal grandmother; Hypertension in her maternal grandmother, mother, paternal grandfather, and paternal grandmother; Liver cancer in  her paternal grandfather; Miscarriages / [place] in her mother; Stroke in her paternal grandfather.  Medications: Current Outpatient Medications  Medication Sig Dispense Refill  . busPIRone (BUSPAR) 10 MG tablet Take 0.5-1.5 tablets (5-15 mg total) by mouth 3 (three) times daily. 90 tablet 1  . DULoxetine (CYMBALTA) 20 MG capsule TAKE 1 CAPSULE BY MOUTH EVERY DAY 30 capsule 1  . JUNEL FE 1/20 1-20 MG-MCG tablet Take 1 tablet by mouth daily.    . Butalbital-APAP-Caffeine 50-325-40 MG capsule Take 1 capsule by mouth every 6 (six) hours as needed for headache.  (Patient not taking: No sig reported)    . ibuprofen (ADVIL,MOTRIN) 600 MG tablet Take 1 tablet (600 mg total) by mouth every 6 (six) hours. (Patient not taking: No sig reported) 30 tablet 0  . Prenatal Vit-Fe Fumarate-FA (PRENATAL MULTIVITAMIN) TABS tablet Take 1 tablet by mouth daily at 12 noon. (Patient not taking: No sig reported)    . SUMAtriptan (IMITREX) 50 MG tablet TAKE 1 TABLET (50 MG TOTAL) BY MOUTH ONCE FOR 1 DOSE. MAY REPEAT IN 2 HOURS IF HEADACHE PERSISTS OR RECURS. MAX 150 MG IN 24 HOURS, 5 DAYS PER MONTH 9 tablet 2   No current facility-administered medications for this visit.   Allergies  Allergen Reactions  . Sulfamethoxazole-Trimethoprim Hives, Swelling and Rash    Angioedema  Angioedema Angioedema Angioedema Angioedema Angioedema Angioedema  . Fluconazole Hives and Rash     If phone visit, billing and coding can please add appropriate modifier if needed

## 2021-05-03 ENCOUNTER — Encounter: Payer: Self-pay | Admitting: Osteopathic Medicine

## 2021-05-06 ENCOUNTER — Encounter: Payer: Self-pay | Admitting: Osteopathic Medicine

## 2021-05-06 DIAGNOSIS — Z9114 Patient's other noncompliance with medication regimen: Secondary | ICD-10-CM

## 2021-05-06 DIAGNOSIS — F419 Anxiety disorder, unspecified: Secondary | ICD-10-CM

## 2021-05-08 ENCOUNTER — Other Ambulatory Visit: Payer: Self-pay | Admitting: Osteopathic Medicine

## 2021-06-01 ENCOUNTER — Encounter: Payer: Self-pay | Admitting: Osteopathic Medicine

## 2021-06-11 ENCOUNTER — Encounter: Payer: Self-pay | Admitting: Family Medicine

## 2021-06-11 ENCOUNTER — Ambulatory Visit (INDEPENDENT_AMBULATORY_CARE_PROVIDER_SITE_OTHER): Payer: BC Managed Care – PPO | Admitting: Family Medicine

## 2021-06-11 VITALS — BP 121/80 | HR 96 | Temp 98.0°F | Ht 63.0 in | Wt 230.0 lb

## 2021-06-11 DIAGNOSIS — G43C1 Periodic headache syndromes in child or adult, intractable: Secondary | ICD-10-CM

## 2021-06-11 MED ORDER — KETOROLAC TROMETHAMINE 60 MG/2ML IM SOLN
60.0000 mg | Freq: Once | INTRAMUSCULAR | Status: AC
  Administered 2021-06-11: 60 mg via INTRAMUSCULAR

## 2021-06-11 MED ORDER — PROPRANOLOL HCL ER 80 MG PO CP24: 80.0000 mg | ORAL_CAPSULE | Freq: Every day | ORAL | 3 refills | Status: DC

## 2021-06-11 MED ORDER — DEXAMETHASONE SODIUM PHOSPHATE 10 MG/ML IJ SOLN
10.0000 mg | Freq: Once | INTRAMUSCULAR | Status: AC
  Administered 2021-06-11: 10 mg via INTRAMUSCULAR

## 2021-06-11 NOTE — Patient Instructions (Signed)
Very nice to meet you today! Let's try propranolol for migraine prevention.  You may continue imitrex as needed.  Follow up in 1 month.

## 2021-06-12 NOTE — Progress Notes (Signed)
Bailey Schwartz - 35 y.o. female MRN [id number]  Date of birth: 08-10-1986  Subjective Chief Complaint  Patient presents with   Migraine    HPI Bailey Schwartz is a 35 year old female here today with complaint of migraine.  She does have history of migraines and reports that her headache today is typical of her previous migraines however she has not been able to stop this one.  She has tried Imitrex x2 which helped for a brief period of time however the headache comes back shortly afterwards.  She denies any nausea but does have light and sound sensitivity.  She has not had any fever, chills or other illness recently.  She does report that her headaches have been occurring more frequently over the past few months.  These were previously just associated with her menstrual cycle however she is now having a couple times per week.  ROS:  A comprehensive ROS was completed and negative except as noted per HPI  Allergies  Allergen Reactions   Sulfamethoxazole-Trimethoprim Hives, Swelling and Rash    Angioedema  Angioedema Angioedema Angioedema Angioedema Angioedema Angioedema   Fluconazole Hives and Rash    Past Medical History:  Diagnosis Date   Depression    Genital warts    Gestational diabetes    History of abnormal cervical Pap smear    Hx of HPV   History of chicken pox    Migraine    Preeclampsia     Past Surgical History:  Procedure Laterality Date   REFRACTIVE SURGERY      Social History   Socioeconomic History   Marital status: Single    Spouse name: Not on file   Number of children: 1   Years of education: Not on file   Highest education level: Not on file  Occupational History    Employer: [employer]  Tobacco Use   Smoking status: Former    Packs/day: 0.18    Types: Cigarettes   Smokeless tobacco: Never  Vaping Use   Vaping Use: Never used  Substance and Sexual Activity   Alcohol use: Yes    Comment: rare   Drug use: Not Currently    Sexual activity: Yes    Birth control/protection: None    Comment: last IC-yesterday   Other Topics Concern   Not on file  Social History Narrative   Not on file   Social Determinants of Health   Financial Resource Strain: Not on file  Food Insecurity: Not on file  Transportation Needs: Not on file  Physical Activity: Not on file  Stress: Not on file  Social Connections: Not on file    Family History  Problem Relation Age of Onset   Miscarriages / Stillbirths Mother    Hypertension Mother    Diabetes Mother    Esophageal cancer Maternal Grandmother    Hyperlipidemia Maternal Grandmother    Hypertension Maternal Grandmother    Alcohol abuse Maternal Grandfather    Hyperlipidemia Paternal Grandmother    Hypertension Paternal Grandmother    Hyperlipidemia Paternal Grandfather    Hypertension Paternal Grandfather    Liver cancer Paternal Grandfather    Stroke Paternal Grandfather     Health Maintenance  Topic Date Due   COVID-19 Vaccine (1) Never done   HIV Screening  Never done   Hepatitis C Screening  Never done   PAP SMEAR-Modifier  Never done   INFLUENZA VACCINE  06/28/2021   TETANUS/TDAP  02/04/2029   Pneumococcal Vaccine 17-62 Years old  Aged  Out   HPV VACCINES  Aged Out     ----------------------------------------------------------------------------------------------------------------------------------------------------------------------------------------------------------------- Physical Exam BP 121/80 (BP Location: Left Arm, Patient Position: Sitting, Cuff Size: Large)   Pulse 96   Temp 98 F (36.7 C)   Ht 5\' 3"  (1.6 m)   Wt 230 lb (104.3 kg)   SpO2 100%   BMI 40.74 kg/m   Physical Exam Constitutional:      Appearance: Normal appearance.  Eyes:     General: No scleral icterus. Cardiovascular:     Rate and Rhythm: Normal rate and regular rhythm.  Pulmonary:     Effort: Pulmonary effort is normal.     Breath sounds: Normal breath sounds.   Skin:    General: Skin is warm and dry.  Neurological:     General: No focal deficit present.     Mental Status: She is alert and oriented to person, place, and time.     Cranial Nerves: No cranial nerve deficit.  Psychiatric:        Mood and Affect: Mood normal.        Behavior: Behavior normal.    ------------------------------------------------------------------------------------------------------------------------------------------------------------------------------------------------------------------- Assessment and Plan  Intractable periodic headache syndrome Her migraines have increased in frequency.  We discussed that it may be time to try adding on a medication for prevention.  We discussed topiramate however she does plan on trying to get pregnant towards the end of this year or first part of next year.  She is currently on contraception however I think propranolol may be a safer option if she were to become pregnant.  She may continue Imitrex as needed.  For her acute headache today she is given injection of Toradol 60 mg and dexamethasone 10 mg.   Meds ordered this encounter  Medications   propranolol ER (INDERAL LA) 80 MG 24 hr capsule    Sig: Take 1 capsule (80 mg total) by mouth daily.    Dispense:  30 capsule    Refill:  3   ketorolac (TORADOL) injection 60 mg   dexamethasone (DECADRON) injection 10 mg    Return in about 4 weeks (around 07/09/2021) for migraines.    This visit occurred during the SARS-CoV-2 public health emergency.  Safety protocols were in place, including screening questions prior to the visit, additional usage of staff PPE, and extensive cleaning of exam room while observing appropriate contact time as indicated for disinfecting solutions.

## 2021-06-12 NOTE — Assessment & Plan Note (Signed)
Her migraines have increased in frequency.  We discussed that it may be time to try adding on a medication for prevention.  We discussed topiramate however she does plan on trying to get pregnant towards the end of this year or first part of next year.  She is currently on contraception however I think propranolol may be a safer option if she were to become pregnant.  She may continue Imitrex as needed.  For her acute headache today she is given injection of Toradol 60 mg and dexamethasone 10 mg.

## 2021-06-15 ENCOUNTER — Other Ambulatory Visit: Payer: Self-pay | Admitting: Medical-Surgical

## 2021-06-15 ENCOUNTER — Encounter: Payer: Self-pay | Admitting: Osteopathic Medicine

## 2021-06-15 DIAGNOSIS — F32A Depression, unspecified: Secondary | ICD-10-CM

## 2021-06-29 ENCOUNTER — Encounter: Payer: Self-pay | Admitting: Osteopathic Medicine

## 2021-06-30 ENCOUNTER — Encounter: Payer: Self-pay | Admitting: Nurse Practitioner

## 2021-06-30 ENCOUNTER — Telehealth: Payer: BC Managed Care – PPO | Admitting: Nurse Practitioner

## 2021-06-30 DIAGNOSIS — R059 Cough, unspecified: Secondary | ICD-10-CM

## 2021-06-30 MED ORDER — PREDNISONE 20 MG PO TABS: 40.0000 mg | ORAL_TABLET | Freq: Every day | ORAL | 0 refills | Status: AC

## 2021-06-30 NOTE — Progress Notes (Signed)
Virtual Visit Consent   Bailey Schwartz, you are scheduled for a virtual visit with Mary-Margaret Daphine Deutscher, FNP, a [facility] provider, today.     Just as with appointments in the office, your consent must be obtained to participate.  Your consent will be active for this visit and any virtual visit you may have with one of our providers in the next 365 days.     If you have a MyChart account, a copy of this consent can be sent to you electronically.  All virtual visits are billed to your insurance company just like a traditional visit in the office.    As this is a virtual visit, video technology does not allow for your provider to perform a traditional examination.  This may limit your provider's ability to fully assess your condition.  If your provider identifies any concerns that need to be evaluated in person or the need to arrange testing (such as labs, EKG, etc.), we will make arrangements to do so.     Although advances in technology are sophisticated, we cannot ensure that it will always work on either your end or our end.  If the connection with a video visit is poor, the visit may have to be switched to a telephone visit.  With either a video or telephone visit, we are not always able to ensure that we have a secure connection.     I need to obtain your verbal consent now.   Are you willing to proceed with your visit today? YES   Natoshia Souter has provided verbal consent on 06/30/2021 for a virtual visit (video or telephone).   Mary-Margaret Daphine Deutscher, FNP   Date: 06/30/2021 9:47 AM   Virtual Visit via Video Note   I, Mary-Margaret Daphine Deutscher, connected with Makayia Duplessis ([id number], 07-16-1986) on 06/30/21 at 10:00 AM EDT by a video-enabled telemedicine application and verified that I am speaking with the correct person using two identifiers.  Location: Patient: Virtual Visit Location Patient: Home Provider: Virtual Visit Location Provider: Mobile   I discussed the limitations of  evaluation and management by telemedicine and the availability of in person appointments. The patient expressed understanding and agreed to proceed.    History of Present Illness: Bailey Schwartz is a 35 y.o. who identifies as a female who was assigned female at birth, and is being seen today for cough.  HPI: Patient has had a cough for 5 days. Denies fever, chills, myalgia or headache,. She has been taking mucinex and cough is now productive. She has done 2 covid test at home and were negative.  Review of Systems  Constitutional:  Negative for chills, fever and malaise/fatigue.  HENT:  Positive for congestion. Negative for sore throat.   Respiratory:  Positive for cough and sputum production. Negative for shortness of breath.   Musculoskeletal:  Negative for myalgias.  Neurological:  Negative for dizziness and headaches.  All other systems reviewed and are negative.  Problems:  Patient Active Problem List   Diagnosis Date Noted   Anxiety 12/31/2019   Abnormal weight gain 12/31/2019   History of migraine 12/31/2019   History of gestational diabetes 12/31/2019   History of pre-eclampsia 12/31/2019   Postpartum care following vaginal delivery 3/10 02/05/2019   Preeclampsia 02/05/2019   Obstetrical laceration -  2nd degree Perineal; right vaginal Sulcus;Labial majus bilateral 02/05/2019   Depression, major, single episode, 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/02/2018   Intractable periodic headache syndrome 05/02/2018    Allergies:  Allergies  Allergen Reactions  Sulfamethoxazole-Trimethoprim Hives, Swelling and Rash    Angioedema  Angioedema Angioedema Angioedema Angioedema Angioedema Angioedema   Fluconazole Hives and Rash   Medications:  Current Outpatient Medications:    busPIRone (BUSPAR) 10 MG tablet, Take 0.5-1.5 tablets (5-15 mg total) by mouth 3 (three) times daily., Disp: 90 tablet, Rfl: 1   DULoxetine (CYMBALTA) 20 MG capsule, TAKE 1 CAPSULE BY MOUTH EVERY DAY, Disp: 30  capsule, Rfl: 1   JUNEL FE 1/20 1-20 MG-MCG tablet, Take 1 tablet by mouth daily., Disp: , Rfl:    propranolol ER (INDERAL LA) 80 MG 24 hr capsule, Take 1 capsule (80 mg total) by mouth daily., Disp: 30 capsule, Rfl: 3   SUMAtriptan (IMITREX) 50 MG tablet, TAKE 1 TABLET (50 MG TOTAL) BY MOUTH ONCE FOR 1 DOSE. MAY REPEAT IN 2 HOURS IF HEADACHE PERSISTS OR RECURS. MAX 150 MG IN 24 HOURS, 5 DAYS PER MONTH, Disp: 9 tablet, Rfl: 2  Observations/Objective: Patient is well-developed, well-nourished in no acute distress.  Resting comfortably  at home.  Head is normocephalic, atraumatic.  No labored breathing.  Speech is clear and coherent with logical content.  Patient is alert and oriented at baseline.  Dry cough noted  Assessment and Plan:  Kashina Mecum in today with chief complaint of No chief complaint on file.   1. Cough 1. Take meds as prescribed 2. Use a cool mist humidifier especially during the winter months and when heat has been humid. 3. Use saline nose sprays frequently 4. Saline irrigations of the nose can be very helpful if done frequently.  * 4X daily for 1 week*  * Use of a nettie pot can be helpful with this. Follow directions with this* 5. Drink plenty of fluids 6. Keep thermostat turn down low 7.For any cough or congestion  Use plain Mucinex- regular strength or max strength is fine   * Children- consult with Pharmacist for dosing 8. For fever or aces or pains- take tylenol or ibuprofen appropriate for age and weight.  * for fevers greater than 101 orally you may alternate ibuprofen and tylenol every  3 hours.   Meds ordered this encounter  Medications   predniSONE (DELTASONE) 20 MG tablet    Sig: Take 2 tablets (40 mg total) by mouth daily with breakfast for 5 days. 2 po daily for 5 days    Dispense:  10 tablet    Refill:  0    Order Specific Question:   Supervising Provider    Answer:   Eber Hong [[id number]]       Follow Up Instructions: I discussed the  assessment and treatment plan with the patient. The patient was provided an opportunity to ask questions and all were answered. The patient agreed with the plan and demonstrated an understanding of the instructions.  A copy of instructions were sent to the patient via MyChart.  The patient was advised to call back or seek an in-person evaluation if the symptoms worsen or if the condition fails to improve as anticipated.  Time:  I spent 12 minutes with the patient via telehealth technology discussing the above problems/concerns.    Mary-Margaret Daphine Deutscher, FNP]

## 2021-07-03 ENCOUNTER — Telehealth: Payer: BC Managed Care – PPO | Admitting: Nurse Practitioner

## 2021-07-03 DIAGNOSIS — J40 Bronchitis, not specified as acute or chronic: Secondary | ICD-10-CM

## 2021-07-03 MED ORDER — AZITHROMYCIN 250 MG PO TABS: ORAL_TABLET | ORAL | 0 refills | Status: AC

## 2021-07-03 NOTE — Progress Notes (Signed)
Virtual Visit Consent   Bailey Schwartz, you are scheduled for a virtual visit with a [place] provider today.     Just as with appointments in the office, your consent must be obtained to participate.  Your consent will be active for this visit and any virtual visit you may have with one of our providers in the next 365 days.     If you have a MyChart account, a copy of this consent can be sent to you electronically.  All virtual visits are billed to your insurance company just like a traditional visit in the office.    As this is a virtual visit, video technology does not allow for your provider to perform a traditional examination.  This may limit your provider's ability to fully assess your condition.  If your provider identifies any concerns that need to be evaluated in person or the need to arrange testing (such as labs, EKG, etc.), we will make arrangements to do so.     Although advances in technology are sophisticated, we cannot ensure that it will always work on either your end or our end.  If the connection with a video visit is poor, the visit may have to be switched to a telephone visit.  With either a video or telephone visit, we are not always able to ensure that we have a secure connection.     I need to obtain your verbal consent now.   Are you willing to proceed with your visit today?    Bailey Schwartz has provided verbal consent on 07/03/2021 for a virtual visit (video or telephone).   Bailey Simas, FNP   Date: 07/03/2021 12:09 PM   Virtual Visit via Video Note   I, Bailey Schwartz, connected with  Bailey Schwartz  ([id number], [birth year]) on 07/03/21 at 12:00 PM EDT by a video-enabled telemedicine application and verified that I am speaking with the correct person using two identifiers.  Location: Patient: Virtual Visit Location Patient: Home Provider: Virtual Visit Location Provider: Office/Clinic   I discussed the limitations of evaluation and management by telemedicine and  the availability of in person appointments. The patient expressed understanding and agreed to proceed.    History of Present Illness: Bailey Schwartz is a 35 y.o. who identifies as a female who was assigned female at birth, and is being seen today with complaints of a cough.   She was seen prior this week and started on a prednisone burst for 5 days, today is day 4. She has also used Mucinex OTC without relief.   She has some nasal congestion in the evening but not during the day.   Her cough has progressed and is more productive.   She has not used an inhaler in the past, denies wheezing, denies a history of asthma Denies fevers.   She has now taken multiple COVID tests that remain negative.    Problems:  Patient Active Problem List   Diagnosis Date Noted   Anxiety 12/31/2019   Abnormal weight gain 12/31/2019   History of migraine 12/31/2019   History of gestational diabetes 12/31/2019   History of pre-eclampsia 12/31/2019   Postpartum care following vaginal delivery 3/10 02/05/2019   Preeclampsia 02/05/2019   Obstetrical laceration -  2nd degree Perineal; right vaginal Sulcus;Labial majus bilateral 02/05/2019   Depression, major, single episode, 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/02/2018   Intractable periodic headache syndrome 05/02/2018    Allergies:  Allergies  Allergen Reactions   Sulfamethoxazole-Trimethoprim Hives, Swelling and Rash  Angioedema  Angioedema Angioedema Angioedema Angioedema Angioedema Angioedema   Fluconazole Hives and Rash   Medications:  Current Outpatient Medications:    busPIRone (BUSPAR) 10 MG tablet, Take 0.5-1.5 tablets (5-15 mg total) by mouth 3 (three) times daily., Disp: 90 tablet, Rfl: 1   DULoxetine (CYMBALTA) 20 MG capsule, TAKE 1 CAPSULE BY MOUTH EVERY DAY, Disp: 30 capsule, Rfl: 1   JUNEL FE 1/20 1-20 MG-MCG tablet, Take 1 tablet by mouth daily., Disp: , Rfl:    predniSONE (DELTASONE) 20 MG tablet, Take 2 tablets (40 mg total) by mouth  daily with breakfast for 5 days. 2 po daily for 5 days, Disp: 10 tablet, Rfl: 0   propranolol ER (INDERAL LA) 80 MG 24 hr capsule, Take 1 capsule (80 mg total) by mouth daily., Disp: 30 capsule, Rfl: 3   SUMAtriptan (IMITREX) 50 MG tablet, TAKE 1 TABLET (50 MG TOTAL) BY MOUTH ONCE FOR 1 DOSE. MAY REPEAT IN 2 HOURS IF HEADACHE PERSISTS OR RECURS. MAX 150 MG IN 24 HOURS, 5 DAYS PER MONTH, Disp: 9 tablet, Rfl: 2  Observations/Objective: Patient is well-developed, well-nourished in no acute distress.  Resting comfortably at home.  Head is normocephalic, atraumatic.  No labored breathing.  Speech is clear and coherent with logical content.  Patient is alert and oriented at baseline.    Assessment and Plan:  1. Bronchitis  - azithromycin (ZITHROMAX) 250 MG tablet; Take 2 tablets on day 1, then 1 tablet daily on days 2 through 5  Dispense: 6 tablet; Refill: 0  Finish prednisone burst as directed Continue Mucinex to assist in mucous production   Increase water intake, rest and assure adequate caloric intake with protein to assist in recovery as discussed.    Follow Up Instructions: I discussed the assessment and treatment plan with the patient. The patient was provided an opportunity to ask questions and all were answered. The patient agreed with the plan and demonstrated an understanding of the instructions.  A copy of instructions were sent to the patient via MyChart.  The patient was advised to call back or seek an in-person evaluation if the symptoms worsen or if the condition fails to improve as anticipated.  Time:  I spent 15 minutes with the patient via telehealth technology discussing the above problems/concerns.    Bailey Simas, FNP

## 2021-07-05 ENCOUNTER — Emergency Department (INDEPENDENT_AMBULATORY_CARE_PROVIDER_SITE_OTHER)
Admission: EM | Admit: 2021-07-05 | Discharge: 2021-07-05 | Disposition: A | Discharge status: Home / Self Care | Payer: BC Managed Care – PPO | Source: Home / Self Care

## 2021-07-05 ENCOUNTER — Other Ambulatory Visit: Payer: Self-pay

## 2021-07-05 ENCOUNTER — Emergency Department (INDEPENDENT_AMBULATORY_CARE_PROVIDER_SITE_OTHER): Payer: BC Managed Care – PPO

## 2021-07-05 DIAGNOSIS — R059 Cough, unspecified: Secondary | ICD-10-CM | POA: Diagnosis not present

## 2021-07-05 DIAGNOSIS — J309 Allergic rhinitis, unspecified: Secondary | ICD-10-CM

## 2021-07-05 MED ORDER — METHYLPREDNISOLONE 4 MG PO TBPK: ORAL_TABLET | ORAL | 0 refills | Status: DC

## 2021-07-05 MED ORDER — BENZONATATE 200 MG PO CAPS: 200.0000 mg | ORAL_CAPSULE | Freq: Three times a day (TID) | ORAL | 0 refills | Status: AC | PRN

## 2021-07-05 MED ORDER — FEXOFENADINE HCL 180 MG PO TABS: 180.0000 mg | ORAL_TABLET | Freq: Every day | ORAL | 0 refills | Status: DC

## 2021-07-05 NOTE — ED Triage Notes (Signed)
Pt c/o cough x 2 weeks. No other sxs. Televisit last week, rx'd steroids. Had another televisit Saturday. Rx'd zpak, 2 days left. Mucinex and Allergy meds prn. Cough is somewhat productive. Some brown spots in phlegm.

## 2021-07-05 NOTE — Discharge Instructions (Addendum)
Advised patient to take medication as directed with food to completion.  Advised patient may take Allegra 180 mg daily for the next 5 to 7 days, then as needed advised patient may use Tessalon Perles daily or as needed for cough.  Encouraged patient to increase daily water intake while taking these medications.

## 2021-07-05 NOTE — ED Provider Notes (Signed)
[facility]    CSN: [id number] Arrival date & time: 07/05/21  0919      History   Chief Complaint Chief Complaint  Patient presents with   Cough    HPI Bailey Schwartz is a 35 y.o. female.   HPI 35 year old female presents with cough for 2 weeks.  Reports was prescribed steroids last week at televisit. Patient reports having another televisit on Saturday prescribed Z-Pak's with 2 days left.  Patient reports taking OTC Mucinex and allergy meds as needed.  Reports cough is productive at times.  Past Medical History:  Diagnosis Date   Depression    Genital warts    Gestational diabetes    History of abnormal cervical Pap smear    Hx of HPV   History of chicken pox    Migraine    Preeclampsia     Patient Active Problem List   Diagnosis Date Noted   Anxiety 12/31/2019   Abnormal weight gain 12/31/2019   History of migraine 12/31/2019   History of gestational diabetes 12/31/2019   History of pre-eclampsia 12/31/2019   Postpartum care following vaginal delivery 3/10 02/05/2019   Preeclampsia 02/05/2019   Obstetrical laceration -  2nd degree Perineal; right vaginal Sulcus;Labial majus bilateral 02/05/2019   Depression, major, single episode, 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/02/2018   Intractable periodic headache syndrome 05/02/2018    Past Surgical History:  Procedure Laterality Date   REFRACTIVE SURGERY      OB History     Gravida  1   Para  1   Term  1   Preterm      AB      Living  1      SAB      IAB      Ectopic      Multiple  0   Live Births  1            Home Medications    Prior to Admission medications   Medication Sig Start Date End Date Taking? Authorizing Provider  azithromycin (ZITHROMAX) 250 MG tablet Take 2 tablets on day 1, then 1 tablet daily on days 2 through 5 07/03/21 07/08/21  Viviano Simas, FNP  benzonatate (TESSALON) 200 MG capsule Take 1 capsule (200 mg total) by mouth 3 (three) times daily as needed for up to  7 days for cough. 07/05/21 07/12/21 Yes Trevor Iha, FNP  busPIRone (BUSPAR) 10 MG tablet Take 0.5-1.5 tablets (5-15 mg total) by mouth 3 (three) times daily. 04/23/21   Sunnie Nielsen, DO  DULoxetine (CYMBALTA) 20 MG capsule TAKE 1 CAPSULE BY MOUTH EVERY DAY 04/19/21   Sunnie Nielsen, DO  fexofenadine [facility] ALLERGY) 180 MG tablet Take 1 tablet (180 mg total) by mouth daily for 15 days. 07/05/21 07/20/21 Yes Trevor Iha, FNP  JUNEL FE 1/20 1-20 MG-MCG tablet Take 1 tablet by mouth daily. 09/15/20   [provider]  methylPREDNISolone (MEDROL DOSEPAK) 4 MG TBPK tablet Take as directed. 07/05/21  Yes Trevor Iha, FNP  predniSONE (DELTASONE) 20 MG tablet Take 2 tablets (40 mg total) by mouth daily with breakfast for 5 days. 2 po daily for 5 days 06/30/21 07/05/21  Bennie Pierini, FNP  propranolol ER (INDERAL LA) 80 MG 24 hr capsule Take 1 capsule (80 mg total) by mouth daily. 06/11/21   Everrett Coombe, DO  SUMAtriptan (IMITREX) 50 MG tablet TAKE 1 TABLET (50 MG TOTAL) BY MOUTH ONCE FOR 1 DOSE. MAY REPEAT IN 2 HOURS IF HEADACHE PERSISTS OR RECURS.  MAX 150 MG IN 24 HOURS, 5 DAYS PER MONTH 05/10/21 05/10/21  Sunnie Nielsen, DO    Family History Family History  Problem Relation Age of Onset   Miscarriages / Stillbirths Mother    Hypertension Mother    Diabetes Mother    Esophageal cancer Maternal Grandmother    Hyperlipidemia Maternal Grandmother    Hypertension Maternal Grandmother    Alcohol abuse Maternal Grandfather    Hyperlipidemia Paternal Grandmother    Hypertension Paternal Grandmother    Hyperlipidemia Paternal Grandfather    Hypertension Paternal Grandfather    Liver cancer Paternal Grandfather    Stroke Paternal Grandfather     Social History Social History   Tobacco Use   Smoking status: Former    Packs/day: 0.18    Types: Cigarettes   Smokeless tobacco: Never  Vaping Use   Vaping Use: Never used  Substance Use Topics   Alcohol use: Yes     Comment: rare   Drug use: Not Currently     Allergies   Sulfamethoxazole-trimethoprim and Fluconazole   Review of Systems Review of Systems  HENT:  Positive for postnasal drip.   Respiratory:  Positive for cough.   All other systems reviewed and are negative.   Physical Exam Triage Vital Signs ED Triage Vitals  Enc Vitals Group     BP 07/05/21 0933 119/83     Pulse Rate 07/05/21 0933 68     Resp 07/05/21 0933 18     Temp 07/05/21 0933 98.9 F (37.2 C)     Temp Source 07/05/21 0933 Oral     SpO2 07/05/21 0933 98 %     Weight --      Schwartz --      Head Circumference --      Peak Flow --      Pain Score 07/05/21 0934 0     Pain Loc --      Pain Edu? --      Excl. in GC? --    No data found.  Updated Vital Signs BP 119/83 (BP Location: Right Arm)   Pulse 68   Temp 98.9 F (37.2 C) (Oral)   Resp 18   LMP 06/14/2021 (Approximate)   SpO2 98%      Physical Exam Vitals and nursing note reviewed.  Constitutional:      General: She is not in acute distress.    Appearance: Normal appearance. She is obese. She is ill-appearing.  HENT:     Head: Normocephalic and atraumatic.     Right Ear: Tympanic membrane, ear canal and external ear normal.     Left Ear: Tympanic membrane, ear canal and external ear normal.     Mouth/Throat:     Mouth: Mucous membranes are moist.     Pharynx: Oropharynx is clear.     Comments: Moderate amount of clear drainage of posterior oropharynx noted Eyes:     Extraocular Movements: Extraocular movements intact.     Conjunctiva/sclera: Conjunctivae normal.     Pupils: Pupils are equal, round, and reactive to light.  Cardiovascular:     Rate and Rhythm: Normal rate and regular rhythm.     Pulses: Normal pulses.     Heart sounds: Normal heart sounds.  Pulmonary:     Effort: Pulmonary effort is normal.     Breath sounds: No wheezing, rhonchi or rales.     Comments: No adventitious breath sounds noted Musculoskeletal:        General:  Normal range of  motion.     Cervical back: Normal range of motion and neck supple. No tenderness.  Lymphadenopathy:     Cervical: No cervical adenopathy.  Skin:    General: Skin is warm and dry.  Neurological:     General: No focal deficit present.     Mental Status: She is alert and oriented to person, place, and time.  Psychiatric:        Mood and Affect: Mood normal.        Behavior: Behavior normal.     UC Treatments / Results  Labs (all labs ordered are listed, but only abnormal results are displayed) Labs Reviewed - No data to display  EKG   Radiology DG Chest 2 View  Result Date: 07/05/2021 CLINICAL DATA:  Cough EXAM: CHEST - 2 VIEW COMPARISON:  December 2021 FINDINGS: The heart size and mediastinal contours are within normal limits. Both lungs are clear. No pleural effusion. The visualized skeletal structures are unremarkable. IMPRESSION: No active cardiopulmonary disease. Electronically Signed   By: Guadlupe Spanish M.D.   On: 07/05/2021 10:21    Procedures Procedures (including critical care time)  Medications Ordered in UC Medications - No data to display  Initial Impression / Assessment and Plan / UC Course  I have reviewed the triage vital signs and the nursing notes.  Pertinent labs & imaging results that were available during my care of the patient were reviewed by me and considered in my medical decision making (see chart for details).     MDM: 1.  Cough-Rx'd Medrol Dosepak and Tessalon Perles; 2.  Allergic rhinitis-Rx'd Allegra.  Patient discharged home, hemodynamically stable.Advised patient to take medication as directed with food to completion.  Advised patient may take Allegra 180 mg daily for the next 5 to 7 days, then as needed advised patient may use Tessalon Perles daily or as needed for cough.  Encouraged patient to increase daily water intake while taking these medications. Final Clinical Impressions(s) / UC Diagnoses   Final diagnoses:  Cough   Allergic rhinitis, unspecified seasonality, unspecified trigger     Discharge Instructions      Advised patient to take medication as directed with food to completion.  Advised patient may take Allegra 180 mg daily for the next 5 to 7 days, then as needed advised patient may use Tessalon Perles daily or as needed for cough.  Encouraged patient to increase daily water intake while taking these medications.     ED Prescriptions     Medication Sig Dispense Auth. Provider   methylPREDNISolone (MEDROL DOSEPAK) 4 MG TBPK tablet Take as directed. 1 each Trevor Iha, FNP   benzonatate (TESSALON) 200 MG capsule Take 1 capsule (200 mg total) by mouth 3 (three) times daily as needed for up to 7 days for cough. 30 capsule Trevor Iha, FNP   fexofenadine [facility] ALLERGY) 180 MG tablet Take 1 tablet (180 mg total) by mouth daily for 15 days. 15 tablet Trevor Iha, FNP      PDMP not reviewed this encounter.   Trevor Iha, FNP 07/05/21 1049

## 2021-07-12 ENCOUNTER — Ambulatory Visit: Payer: Medicaid Other | Admitting: Osteopathic Medicine

## 2021-07-28 ENCOUNTER — Ambulatory Visit: Payer: BC Managed Care – PPO | Admitting: Osteopathic Medicine

## 2021-08-05 ENCOUNTER — Ambulatory Visit: Payer: BC Managed Care – PPO | Admitting: Osteopathic Medicine

## 2021-08-12 DIAGNOSIS — Z8759 Personal history of other complications of pregnancy, childbirth and the puerperium: Secondary | ICD-10-CM | POA: Diagnosis not present

## 2021-08-12 DIAGNOSIS — Z8632 Personal history of gestational diabetes: Secondary | ICD-10-CM | POA: Diagnosis not present

## 2021-08-12 DIAGNOSIS — Z01419 Encounter for gynecological examination (general) (routine) without abnormal findings: Secondary | ICD-10-CM | POA: Diagnosis not present

## 2021-09-12 DIAGNOSIS — G43019 Migraine without aura, intractable, without status migrainosus: Secondary | ICD-10-CM | POA: Diagnosis not present

## 2021-09-14 DIAGNOSIS — G43019 Migraine without aura, intractable, without status migrainosus: Secondary | ICD-10-CM | POA: Diagnosis not present

## 2021-09-14 DIAGNOSIS — G43709 Chronic migraine without aura, not intractable, without status migrainosus: Secondary | ICD-10-CM | POA: Insufficient documentation

## 2021-09-14 HISTORY — DX: Chronic migraine without aura, not intractable, without status migrainosus: G43.709

## 2021-09-16 ENCOUNTER — Emergency Department (HOSPITAL_BASED_OUTPATIENT_CLINIC_OR_DEPARTMENT_OTHER): Payer: BC Managed Care – PPO

## 2021-09-16 ENCOUNTER — Other Ambulatory Visit: Payer: Self-pay

## 2021-09-16 ENCOUNTER — Emergency Department (HOSPITAL_BASED_OUTPATIENT_CLINIC_OR_DEPARTMENT_OTHER)
Admission: EM | Admit: 2021-09-16 | Discharge: 2021-09-17 | Disposition: A | Discharge status: Home / Self Care | Payer: BC Managed Care – PPO | Attending: Emergency Medicine | Admitting: Emergency Medicine

## 2021-09-16 ENCOUNTER — Encounter (HOSPITAL_BASED_OUTPATIENT_CLINIC_OR_DEPARTMENT_OTHER): Payer: Self-pay

## 2021-09-16 DIAGNOSIS — Z87891 Personal history of nicotine dependence: Secondary | ICD-10-CM | POA: Diagnosis not present

## 2021-09-16 DIAGNOSIS — G43909 Migraine, unspecified, not intractable, without status migrainosus: Secondary | ICD-10-CM | POA: Diagnosis not present

## 2021-09-16 DIAGNOSIS — G43809 Other migraine, not intractable, without status migrainosus: Secondary | ICD-10-CM

## 2021-09-16 DIAGNOSIS — R519 Headache, unspecified: Secondary | ICD-10-CM | POA: Diagnosis not present

## 2021-09-16 LAB — PREGNANCY, URINE: Preg Test, Ur: NEGATIVE

## 2021-09-16 MED ORDER — METOCLOPRAMIDE HCL 5 MG/ML IJ SOLN
10.0000 mg | Freq: Once | INTRAMUSCULAR | Status: AC
  Administered 2021-09-17: 10 mg via INTRAVENOUS
  Filled 2021-09-16: qty 2

## 2021-09-16 MED ORDER — DIPHENHYDRAMINE HCL 50 MG/ML IJ SOLN
12.5000 mg | Freq: Once | INTRAMUSCULAR | Status: AC
  Administered 2021-09-17: 12.5 mg via INTRAVENOUS
  Filled 2021-09-16: qty 1

## 2021-09-16 MED ORDER — KETOROLAC TROMETHAMINE 30 MG/ML IJ SOLN
30.0000 mg | Freq: Once | INTRAMUSCULAR | Status: AC
  Administered 2021-09-17: 30 mg via INTRAVENOUS
  Filled 2021-09-16: qty 1

## 2021-09-16 MED ORDER — DIVALPROEX SODIUM ER 250 MG PO TB24
500.0000 mg | ORAL_TABLET | Freq: Once | ORAL | Status: AC
  Administered 2021-09-17: 500 mg via ORAL

## 2021-09-16 MED ORDER — MAGNESIUM SULFATE 2 GM/50ML IV SOLN
2.0000 g | Freq: Once | INTRAVENOUS | Status: AC
  Administered 2021-09-17: 2 g via INTRAVENOUS
  Filled 2021-09-16: qty 50

## 2021-09-16 NOTE — ED Triage Notes (Signed)
Pt c/o migraine x 2 weeks-seen at UC x 2 without relief-NAD-steady gait

## 2021-09-17 ENCOUNTER — Encounter (HOSPITAL_BASED_OUTPATIENT_CLINIC_OR_DEPARTMENT_OTHER): Payer: Self-pay | Admitting: Emergency Medicine

## 2021-09-17 ENCOUNTER — Telehealth: Payer: Self-pay | Admitting: General Practice

## 2021-09-17 DIAGNOSIS — G43909 Migraine, unspecified, not intractable, without status migrainosus: Secondary | ICD-10-CM | POA: Diagnosis not present

## 2021-09-17 NOTE — Telephone Encounter (Signed)
Transition Care Management Unsuccessful Follow-up Telephone Call  Date of discharge and from where:  09/17/21 from [facility]  Attempts:  1st Attempt  Reason for unsuccessful TCM follow-up call:  Left voice message

## 2021-09-17 NOTE — ED Provider Notes (Signed)
[facility] EMERGENCY DEPARTMENT Provider Note   CSN: [id number] Arrival date & time: 09/16/21  2154     History Chief Complaint  Patient presents with   Migraine    Bailey Schwartz is a 35 y.o. female.  The history is provided by the patient.  Migraine This is a recurrent problem. The current episode started more than 1 week ago. The problem occurs constantly. The problem has not changed since onset.Pertinent negatives include no chest pain, no abdominal pain and no shortness of breath. Nothing aggravates the symptoms. Nothing relieves the symptoms. Treatments tried: sumatriptan. The treatment provided no relief.  Patient with h/o migraine presents with 2 weeks of left sided migraine.  Not sudden onset, not the worst HA of her life.  No weakness, no numbness.  No changes in vision or speech or cognition.  No neck pain.  No f/c/r.      Past Medical History:  Diagnosis Date   Depression    Genital warts    Gestational diabetes    History of abnormal cervical Pap smear    Hx of HPV   History of chicken pox    Migraine    Preeclampsia     Patient Active Problem List   Diagnosis Date Noted   Anxiety 12/31/2019   Abnormal weight gain 12/31/2019   History of migraine 12/31/2019   History of gestational diabetes 12/31/2019   History of pre-eclampsia 12/31/2019   Postpartum care following vaginal delivery 3/10 02/05/2019   Preeclampsia 02/05/2019   Obstetrical laceration -  2nd degree Perineal; right vaginal Sulcus;Labial majus bilateral 02/05/2019   Depression, major, single episode, 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/02/2018   Intractable periodic headache syndrome 05/02/2018    Past Surgical History:  Procedure Laterality Date   REFRACTIVE SURGERY       OB History     Gravida  1   Para  1   Term  1   Preterm      AB      Living  1      SAB      IAB      Ectopic      Multiple  0   Live Births  1           Family History  Problem  Relation Age of Onset   Miscarriages / Stillbirths Mother    Hypertension Mother    Diabetes Mother    Esophageal cancer Maternal Grandmother    Hyperlipidemia Maternal Grandmother    Hypertension Maternal Grandmother    Alcohol abuse Maternal Grandfather    Hyperlipidemia Paternal Grandmother    Hypertension Paternal Grandmother    Hyperlipidemia Paternal Grandfather    Hypertension Paternal Grandfather    Liver cancer Paternal Grandfather    Stroke Paternal Grandfather     Social History   Tobacco Use   Smoking status: Former    Packs/day: 0.18    Types: Cigarettes   Smokeless tobacco: Never  Vaping Use   Vaping Use: Never used  Substance Use Topics   Alcohol use: Not Currently   Drug use: Not Currently    Home Medications Prior to Admission medications   Medication Sig Start Date End Date Taking? Authorizing Provider  busPIRone (BUSPAR) 10 MG tablet Take 0.5-1.5 tablets (5-15 mg total) by mouth 3 (three) times daily. 04/23/21   Sunnie Nielsen, DO  DULoxetine (CYMBALTA) 20 MG capsule TAKE 1 CAPSULE BY MOUTH EVERY DAY 04/19/21   Sunnie Nielsen, DO  fexofenadine [facility] ALLERGY) 180  MG tablet Take 1 tablet (180 mg total) by mouth daily for 15 days. 07/05/21 07/20/21  Trevor Iha, FNP  JUNEL FE 1/20 1-20 MG-MCG tablet Take 1 tablet by mouth daily. 09/15/20   [provider]  methylPREDNISolone (MEDROL DOSEPAK) 4 MG TBPK tablet Take as directed. 07/05/21   Trevor Iha, FNP  propranolol ER (INDERAL LA) 80 MG 24 hr capsule Take 1 capsule (80 mg total) by mouth daily. 06/11/21   Everrett Coombe, DO  SUMAtriptan (IMITREX) 50 MG tablet TAKE 1 TABLET (50 MG TOTAL) BY MOUTH ONCE FOR 1 DOSE. MAY REPEAT IN 2 HOURS IF HEADACHE PERSISTS OR RECURS. MAX 150 MG IN 24 HOURS, 5 DAYS PER MONTH 05/10/21 05/10/21  Sunnie Nielsen, DO    Allergies    Sulfamethoxazole-trimethoprim and Fluconazole  Review of Systems   Review of Systems  Constitutional:  Negative for fever.   HENT:  Negative for facial swelling.   Eyes:  Negative for redness.  Respiratory:  Negative for shortness of breath.   Cardiovascular:  Negative for chest pain.  Gastrointestinal:  Negative for abdominal pain.  Genitourinary:  Negative for difficulty urinating.  Musculoskeletal:  Negative for neck stiffness.  Skin:  Negative for rash.  Neurological:  Negative for seizures, facial asymmetry, speech difficulty, weakness and numbness.  Psychiatric/Behavioral:  Negative for agitation.   All other systems reviewed and are negative.  Physical Exam Updated Vital Signs BP 113/77   Pulse 62   Temp 98.1 F (36.7 C) (Oral)   Resp 18   Ht 5\' 3"  (1.6 m)   Wt 107 kg   LMP 08/30/2021   SpO2 100%   BMI 41.81 kg/m   Physical Exam Vitals and nursing note reviewed.  Constitutional:      General: She is not in acute distress.    Appearance: Normal appearance.  HENT:     Head: Normocephalic and atraumatic.     Nose: Nose normal.     Mouth/Throat:     Mouth: Mucous membranes are moist.     Pharynx: Oropharynx is clear.  Eyes:     Extraocular Movements: Extraocular movements intact.     Conjunctiva/sclera: Conjunctivae normal.     Pupils: Pupils are equal, round, and reactive to light.     Comments: Disk margins sharp, no proptosis, intact cognition   Cardiovascular:     Rate and Rhythm: Normal rate and regular rhythm.     Pulses: Normal pulses.     Heart sounds: Normal heart sounds.  Pulmonary:     Effort: Pulmonary effort is normal.     Breath sounds: Normal breath sounds.  Abdominal:     General: Bowel sounds are normal.     Palpations: Abdomen is soft.     Tenderness: There is no abdominal tenderness. There is no guarding.  Musculoskeletal:        General: Normal range of motion.     Cervical back: Normal range of motion and neck supple. No rigidity.     Right lower leg: No edema.     Left lower leg: No edema.  Lymphadenopathy:     Cervical: No cervical adenopathy.  Skin:     General: Skin is warm and dry.  Neurological:     General: No focal deficit present.     Mental Status: She is alert and oriented to person, place, and time.     Cranial Nerves: No cranial nerve deficit.     Deep Tendon Reflexes: Reflexes normal.  Psychiatric:  Mood and Affect: Mood normal.        Behavior: Behavior normal.    ED Results / Procedures / Treatments   Labs (all labs ordered are listed, but only abnormal results are displayed) Labs Reviewed  PREGNANCY, URINE    EKG None  Radiology CT Head Wo Contrast  Result Date: 09/16/2021 CLINICAL DATA:  Migraine headaches for 2 weeks, facial trauma EXAM: CT HEAD WITHOUT CONTRAST TECHNIQUE: Contiguous axial images were obtained from the base of the skull through the vertex without intravenous contrast. COMPARISON:  None. FINDINGS: Brain: No acute infarct or hemorrhage. Lateral ventricles and midline structures are unremarkable. No acute extra-axial fluid collections. No mass effect. Vascular: No hyperdense vessel or unexpected calcification. Skull: Normal. Negative for fracture or focal lesion. Sinuses/Orbits: No acute finding. Other: None. IMPRESSION: 1. No acute intracranial process. Electronically Signed   By: Sharlet Salina M.D.   On: 09/16/2021 23:47    Procedures Procedures   Medications Ordered in ED Medications  magnesium sulfate IVPB 2 g 50 mL (2 g Intravenous New Bag/Given 09/17/21 0020)  ketorolac (TORADOL) 30 MG/ML injection 30 mg (30 mg Intravenous Given 09/17/21 0011)  metoCLOPramide (REGLAN) injection 10 mg (10 mg Intravenous Given 09/17/21 0016)  diphenhydrAMINE (BENADRYL) injection 12.5 mg (12.5 mg Intravenous Given 09/17/21 0012)  divalproex (DEPAKOTE ER) 24 hr tablet 500 mg (500 mg Oral Given 09/17/21 0023)    ED Course  I have reviewed the triage vital signs and the nursing notes.  Pertinent labs & imaging results that were available during my care of the patient were reviewed by me and considered  in my medical decision making (see chart for details).   Pain improved post meds.  I do not believe this is an ICH, CT and exam are normal.  I do not suspect meningitis as the patient would have perished after 2 weeks.  Nor do I believe this is a cavernosus sinus with normal exam and CT scan.  Feeling improved.  Stable for discharge.  Follow up with a headache specialist for ongoing care.    Findley Blankenbaker was evaluated in Emergency Department on 09/17/2021 for the symptoms described in the history of present illness. She was evaluated in the context of the global COVID-19 pandemic, which necessitated consideration that the patient might be at risk for infection with the SARS-CoV-2 virus that causes COVID-19. Institutional protocols and algorithms that pertain to the evaluation of patients at risk for COVID-19 are in a state of rapid change based on information released by regulatory bodies including the CDC and federal and state organizations. These policies and algorithms were followed during the patient's care in the ED.  Final Clinical Impression(s) / ED Diagnoses Final diagnoses:  None  Return for intractable cough, coughing up blood, fevers > 100.4 unrelieved by medication, shortness of breath, intractable vomiting, chest pain, shortness of breath, weakness, numbness, changes in speech, facial asymmetry, abdominal pain, passing out, Inability to tolerate liquids or food, cough, altered mental status or any concerns. No signs of systemic illness or infection. The patient is nontoxic-appearing on exam and vital signs are within normal limits.  I have reviewed the triage vital signs and the nursing notes. Pertinent labs & imaging results that were available during my care of the patient were reviewed by me and considered in my medical decision making (see chart for details). After history, exam, and medical workup I feel the patient has been appropriately medically screened and is safe for discharge  home. Pertinent diagnoses were  discussed with the patient. Patient was given return precautions.   Rx / DC Orders ED Discharge Orders     None        Caide Campi, MD 09/17/21 [id number]

## 2021-09-20 ENCOUNTER — Encounter: Payer: Self-pay | Admitting: Family Medicine

## 2021-09-20 NOTE — Telephone Encounter (Signed)
Transition Care Management Unsuccessful Follow-up Telephone Call  Date of discharge and from where:  09/17/2021 from [facility]  Attempts:  2nd Attempt  Reason for unsuccessful TCM follow-up call:  Left voice message

## 2021-09-20 NOTE — Telephone Encounter (Signed)
Pt given appt for 09/21/2021 at 4pm with Hyman Hopes.  Tiajuana Amass, CMA

## 2021-09-21 ENCOUNTER — Ambulatory Visit (INDEPENDENT_AMBULATORY_CARE_PROVIDER_SITE_OTHER): Payer: BC Managed Care – PPO | Admitting: Family Medicine

## 2021-09-21 ENCOUNTER — Encounter: Payer: Self-pay | Admitting: Family Medicine

## 2021-09-21 VITALS — BP 113/79 | HR 75 | Temp 98.7°F | Wt 230.1 lb

## 2021-09-21 DIAGNOSIS — R519 Headache, unspecified: Secondary | ICD-10-CM

## 2021-09-21 MED ORDER — CYCLOBENZAPRINE HCL 5 MG PO TABS
5.0000 mg | ORAL_TABLET | Freq: Three times a day (TID) | ORAL | 1 refills | Status: DC | PRN
Start: 2021-09-21 — End: 2022-09-18

## 2021-09-21 NOTE — Progress Notes (Signed)
Established Patient Office Visit  Subjective:  Patient ID: Bailey Schwartz, female    DOB: 13-Jan-1986  Age: 35 y.o. MRN: [id number]  CC:  Chief Complaint  Patient presents with   Follow-up    Migraines     HPI Bailey Schwartz presents for hospital follow-up for migraines.   Patient reports she has had a history of migraines since her early 76s.  Typically they last for 1 to 3 days and then resolve often with sumatriptan and NSAIDs/Tylenol.  Current migraine has been ongoing for almost 2 weeks now.  She has been to both urgent care and the ED.  Both of them were given her migraine cocktails which did help temporarily but only for about 6 hours before symptoms returned.  She was started on propanolol prophylactically.  She does think this may be helping some.  She did have a CT scan done which was normal.  And an MRI was ordered which she is scheduled for in about 2 weeks.  The ED gave her information to follow-up with a neurologist.  She is waiting to schedule.  Patient cannot recall any specific new triggers that may have made this migraine worse than others, other than recently stopping her birth control in August.  She and her husband are hoping to get pregnant relatively soon.  She has noticed some correlation to her migraines with her cycles, but this 1 is lasting longer and is more intense than usual.  She has been avoiding potential trigger foods, trying to sleep adequately, using ice/heat, hydration, ibuprofen, Tylenol, sumatriptan hand, professional massage.  States she does feel significantly better today.  While at rest, with sitting/lying still she does not notice any discomfort.  However if she starts moving around too quickly or doing too many activities she does feel the throbbing return it can get up to 5 out of 10.  She will stop what she is doing and lie down to rest and start to feel better.  Yesterday she did take 2 ibuprofen however she is decided to try to wean herself off of  anything in case she may be having some rebound effect.  She reports this migraine has mostly stayed on her left side, she has had photophobia, phonophobia, but no aura.  She did have some mild nausea earlier last week but that has resolved now.  No vomiting.    Past Medical History:  Diagnosis Date   Depression    Genital warts    Gestational diabetes    History of abnormal cervical Pap smear    Hx of HPV   History of chicken pox    Migraine    Preeclampsia     Past Surgical History:  Procedure Laterality Date   REFRACTIVE SURGERY      Family History  Problem Relation Age of Onset   Miscarriages / Stillbirths Mother    Hypertension Mother    Diabetes Mother    Esophageal cancer Maternal Grandmother    Hyperlipidemia Maternal Grandmother    Hypertension Maternal Grandmother    Alcohol abuse Maternal Grandfather    Hyperlipidemia Paternal Grandmother    Hypertension Paternal Grandmother    Hyperlipidemia Paternal Grandfather    Hypertension Paternal Grandfather    Liver cancer Paternal Grandfather    Stroke Paternal Grandfather     Social History   Socioeconomic History   Marital status: Single    Spouse name: Not on file   Number of children: 1   Years of education: Not on  file   Highest education level: Not on file  Occupational History    Employer: [employer]  Tobacco Use   Smoking status: Former    Packs/day: 0.18    Types: Cigarettes   Smokeless tobacco: Never  Vaping Use   Vaping Use: Never used  Substance and Sexual Activity   Alcohol use: Not Currently   Drug use: Not Currently   Sexual activity: Not on file  Other Topics Concern   Not on file  Social History Narrative   Not on file   Social Determinants of Health   Financial Resource Strain: Not on file  Food Insecurity: Not on file  Transportation Needs: Not on file  Physical Activity: Not on file  Stress: Not on file  Social Connections: Not on file  Intimate  Partner Violence: Not on file    Outpatient Medications Prior to Visit  Medication Sig Dispense Refill   propranolol ER (INDERAL LA) 80 MG 24 hr capsule Take 1 capsule (80 mg total) by mouth daily. 30 capsule 3   SUMAtriptan (IMITREX) 50 MG tablet TAKE 1 TABLET (50 MG TOTAL) BY MOUTH ONCE FOR 1 DOSE. MAY REPEAT IN 2 HOURS IF HEADACHE PERSISTS OR RECURS. MAX 150 MG IN 24 HOURS, 5 DAYS PER MONTH 9 tablet 2   busPIRone (BUSPAR) 10 MG tablet Take 0.5-1.5 tablets (5-15 mg total) by mouth 3 (three) times daily. (Patient not taking: Reported on 09/21/2021) 90 tablet 1   DULoxetine (CYMBALTA) 20 MG capsule TAKE 1 CAPSULE BY MOUTH EVERY DAY (Patient not taking: Reported on 09/21/2021) 30 capsule 1   fexofenadine (ALLEGRA ALLERGY) 180 MG tablet Take 1 tablet (180 mg total) by mouth daily for 15 days. 15 tablet 0   JUNEL FE 1/20 1-20 MG-MCG tablet Take 1 tablet by mouth daily. (Patient not taking: Reported on 09/21/2021)     methylPREDNISolone (MEDROL DOSEPAK) 4 MG TBPK tablet Take as directed. (Patient not taking: Reported on 09/21/2021) 1 each 0   No facility-administered medications prior to visit.    Allergies  Allergen Reactions   Sulfamethoxazole-Trimethoprim Hives, Swelling and Rash    Angioedema  Other reaction(s): Angioedema (ALLERGY/intolerance)   Fluconazole Hives and Rash    ROS Review of Systems All review of systems negative except what is listed in the HPI    Objective:    Physical Exam Vitals reviewed.  Constitutional:      Appearance: Normal appearance.  HENT:     Head: Normocephalic and atraumatic.  Eyes:     Extraocular Movements: Extraocular movements intact.     Conjunctiva/sclera: Conjunctivae normal.     Pupils: Pupils are equal, round, and reactive to light.  Cardiovascular:     Rate and Rhythm: Normal rate and regular rhythm.  Pulmonary:     Effort: Pulmonary effort is normal.     Breath sounds: Normal breath sounds.  Musculoskeletal:        General:  Normal range of motion.     Cervical back: Normal range of motion and neck supple.  Skin:    General: Skin is warm and dry.  Neurological:     General: No focal deficit present.     Mental Status: She is alert and oriented to person, place, and time. Mental status is at baseline.     Cranial Nerves: No cranial nerve deficit.     Sensory: No sensory deficit.     Motor: No weakness.     Coordination: Coordination normal.  Gait: Gait normal.  Psychiatric:        Mood and Affect: Mood normal.        Behavior: Behavior normal.        Thought Content: Thought content normal.        Judgment: Judgment normal.    BP 113/79 (BP Location: Left Arm, Patient Position: Sitting, Cuff Size: Large)   Pulse 75   Temp 98.7 F (37.1 C) (Oral)   Wt 230 lb 1.3 oz (104.4 kg)   LMP 08/30/2021   BMI 40.76 kg/m  Wt Readings from Last 3 Encounters:  09/21/21 230 lb 1.3 oz (104.4 kg)  09/16/21 236 lb (107 kg)  06/11/21 230 lb (104.3 kg)     Health Maintenance Due  Topic Date Due   HIV Screening  Never done   Hepatitis C Screening  Never done   PAP SMEAR-Modifier  Never done    There are no preventive care reminders to display for this patient.  Lab Results  Component Value Date   TSH 1.04 11/10/2020   Lab Results  Component Value Date   WBC 13.0 (H) 11/10/2020   HGB 15.0 11/10/2020   HCT 44.2 11/10/2020   MCV 84.2 11/10/2020   PLT 358 11/10/2020   Lab Results  Component Value Date   NA 137 11/10/2020   K 3.5 11/10/2020   CO2 23 11/10/2020   GLUCOSE 108 (H) 11/10/2020   BUN 11 11/10/2020   CREATININE 0.74 11/10/2020   BILITOT 0.3 11/10/2020   ALKPHOS 160 (H) 02/04/2019   AST 51 (H) 11/10/2020   ALT 123 (H) 11/10/2020   PROT 7.2 11/10/2020   ALBUMIN 2.6 (L) 02/04/2019   CALCIUM 9.3 11/10/2020   ANIONGAP 7 02/04/2019   Lab Results  Component Value Date   CHOL 154 12/31/2019   Lab Results  Component Value Date   HDL 59 12/31/2019   Lab Results  Component Value  Date   LDLCALC 81 12/31/2019   Lab Results  Component Value Date   TRIG 60 12/31/2019   Lab Results  Component Value Date   CHOLHDL 2.6 12/31/2019   Lab Results  Component Value Date   HGBA1C 5.6 12/31/2019      Assessment & Plan:   1. Acute nonintractable headache, unspecified headache type Keep MRI as ordered Schedule with neurologist - if you need a referral then please let us know Adding Flexeril for some headache relief as needed Can continue current regimen, but be cautious with potential for rebound headaches if frequently using NSAIDs/tylenol  Possible triggers: chocolate, alcohol, some cheeses, MSG, artificial sweeteners, perfume, stress, too much/little sleep, hunger  Nutritional supplements to consider: CoQ10, Magnesium, Riboflavin (Vitamin B2)   - cyclobenzaprine (FLEXERIL) 5 MG tablet; Take 1 tablet (5 mg total) by mouth 3 (three) times daily as needed for muscle spasms.  Dispense: 30 tablet; Refill: 1    Follow-up: Return in about 2 months (around 11/21/2021) for migraine mgt f/u, est with PCP.   Please contact office for sooner follow-up if symptoms do not improve or worsen. Seek emergency care if symptoms become severe.   Clayborne Dana, NP

## 2021-09-21 NOTE — Patient Instructions (Addendum)
Keep MRI as ordered Schedule with neurologist - if you need a referral then please let us know Adding Flexeril for some headache relief as needed Can continue current regimen, but be cautious with potential for rebound headaches if frequently using NSAIDs/tylenol  Possible triggers: chocolate, alcohol, some cheeses, MSG, artificial sweeteners, perfume, stress, too much/little sleep, hunger  Nutritional supplements to consider: CoQ10, Magnesium, Riboflavin (Vitamin B2)

## 2021-09-22 NOTE — Telephone Encounter (Signed)
Transition Care Management Follow-up Telephone Call Date of discharge and from where: 09/17/21 from [facility] How have you been since you were released from the hospital? Patient had OV on 09/21/21 with Hyman Hopes, NP Any questions or concerns? No

## 2021-10-01 DIAGNOSIS — R519 Headache, unspecified: Secondary | ICD-10-CM | POA: Diagnosis not present

## 2021-10-04 ENCOUNTER — Encounter: Payer: Self-pay | Admitting: Family Medicine

## 2021-10-04 NOTE — Telephone Encounter (Signed)
I think that we would probably need to see her in the office to discuss the more advanced migraine preventative agents such as Aimovig, Emgality, and Nurtec.

## 2021-10-15 ENCOUNTER — Encounter: Payer: Self-pay | Admitting: Family Medicine

## 2021-10-15 ENCOUNTER — Ambulatory Visit (INDEPENDENT_AMBULATORY_CARE_PROVIDER_SITE_OTHER): Payer: BC Managed Care – PPO | Admitting: Family Medicine

## 2021-10-15 ENCOUNTER — Other Ambulatory Visit: Payer: Self-pay

## 2021-10-15 ENCOUNTER — Telehealth: Payer: Self-pay

## 2021-10-15 VITALS — BP 104/67 | HR 83 | Ht 63.0 in | Wt 230.0 lb

## 2021-10-15 DIAGNOSIS — G43001 Migraine without aura, not intractable, with status migrainosus: Secondary | ICD-10-CM

## 2021-10-15 HISTORY — DX: Migraine without aura, not intractable, with status migrainosus: G43.001

## 2021-10-15 MED ORDER — PREDNISONE 10 MG PO TABS: ORAL_TABLET | ORAL | 0 refills | Status: AC

## 2021-10-15 MED ORDER — ELETRIPTAN HYDROBROMIDE 20 MG PO TABS: 20.0000 mg | ORAL_TABLET | ORAL | 1 refills | Status: DC | PRN

## 2021-10-15 NOTE — Progress Notes (Signed)
Pt reports that she has been experiencing migraine since October. She states that she had a break for about 6 days last week.

## 2021-10-15 NOTE — Telephone Encounter (Signed)
Medication: eletriptan (RELPAX) 20 MG tablet Prior authorization submitted via CoverMyMeds on 10/15/2021 PA submission pending

## 2021-10-15 NOTE — Progress Notes (Signed)
Acute Office Visit  Subjective:    Patient ID: Bailey Schwartz, female    DOB: January 31, 1986, 35 y.o.   MRN: [id number]  Chief Complaint  Patient presents with   Migraine    HPI Patient is in today for HA. Hx of migraines.  She felt like overall she was doing really well until about a month ago, when she came off of her birth control, and ever since then she has been having more frequent headaches  The longest break was about 6 days. Mostly behind her left eye and radiates around her ear/head.  She denies any known triggers.  In the past weather change and menstrual cycle have been triggers but this seems different.  She went to the emergency department on October 20 and had a CT of the head that was negative.  Given tramadol.  She then followed up and had an MRI for further evaluation which was also negative.  Her OB/GYN made a referral to neurology.  Has a consult with A SPECIALIST  IN January. She is on Inderal and Imitrex. She feels like the Imitrex used to work well, but over the last month it is rarely effective.  Occ nausea but not often.    Recent CT and MRI  of head is normal.     Past Medical History:  Diagnosis Date   Depression    Genital warts    Gestational diabetes    History of abnormal cervical Pap smear    Hx of HPV   History of chicken pox    Migraine    Preeclampsia     Past Surgical History:  Procedure Laterality Date   REFRACTIVE SURGERY      Family History  Problem Relation Age of Onset   Miscarriages / Stillbirths Mother    Hypertension Mother    Diabetes Mother    Esophageal cancer Maternal Grandmother    Hyperlipidemia Maternal Grandmother    Hypertension Maternal Grandmother    Alcohol abuse Maternal Grandfather    Hyperlipidemia Paternal Grandmother    Hypertension Paternal Grandmother    Hyperlipidemia Paternal Grandfather    Hypertension Paternal Grandfather    Liver cancer Paternal Grandfather    Stroke Paternal Grandfather     Social  History   Socioeconomic History   Marital status: Single    Spouse name: Not on file   Number of children: 1   Years of education: Not on file   Highest education level: Not on file  Occupational History    Employer: [employer]  Tobacco Use   Smoking status: Former    Packs/day: 0.18    Types: Cigarettes   Smokeless tobacco: Never  Vaping Use   Vaping Use: Never used  Substance and Sexual Activity   Alcohol use: Not Currently   Drug use: Not Currently   Sexual activity: Not on file  Other Topics Concern   Not on file  Social History Narrative   Not on file   Social Determinants of Health   Financial Resource Strain: Not on file  Food Insecurity: Not on file  Transportation Needs: Not on file  Physical Activity: Not on file  Stress: Not on file  Social Connections: Not on file  Intimate Partner Violence: Not on file    Outpatient Medications Prior to Visit  Medication Sig Dispense Refill   propranolol ER (INDERAL LA) 80 MG 24 hr capsule Take 1 capsule (80 mg total) by mouth daily. 30 capsule 3  cyclobenzaprine (FLEXERIL) 5 MG tablet Take 1 tablet (5 mg total) by mouth 3 (three) times daily as needed for muscle spasms. 30 tablet 1   busPIRone (BUSPAR) 10 MG tablet Take 0.5-1.5 tablets (5-15 mg total) by mouth 3 (three) times daily. (Patient not taking: Reported on 09/21/2021) 90 tablet 1   DULoxetine (CYMBALTA) 20 MG capsule TAKE 1 CAPSULE BY MOUTH EVERY DAY 30 capsule 1   fexofenadine (ALLEGRA ALLERGY) 180 MG tablet Take 1 tablet (180 mg total) by mouth daily for 15 days. 15 tablet 0   JUNEL FE 1/20 1-20 MG-MCG tablet Take 1 tablet by mouth daily. (Patient not taking: Reported on 09/21/2021)     methylPREDNISolone (MEDROL DOSEPAK) 4 MG TBPK tablet Take as directed. (Patient not taking: Reported on 09/21/2021) 1 each 0   SUMAtriptan (IMITREX) 50 MG tablet TAKE 1 TABLET (50 MG TOTAL) BY MOUTH ONCE FOR 1 DOSE. MAY REPEAT IN 2 HOURS IF HEADACHE PERSISTS  OR RECURS. MAX 150 MG IN 24 HOURS, 5 DAYS PER MONTH 9 tablet 2   No facility-administered medications prior to visit.    Allergies  Allergen Reactions   Sulfamethoxazole-Trimethoprim Hives, Swelling and Rash    Angioedema  Other reaction(s): Angioedema (ALLERGY/intolerance)   Fluconazole Hives and Rash    Review of Systems     Objective:    Physical Exam Constitutional:      Appearance: She is well-developed.  HENT:     Head: Normocephalic and atraumatic.     Right Ear: External ear normal.     Left Ear: External ear normal.     Nose: Nose normal.  Eyes:     Conjunctiva/sclera: Conjunctivae normal.     Pupils: Pupils are equal, round, and reactive to light.  Neck:     Thyroid: No thyromegaly.  Cardiovascular:     Rate and Rhythm: Normal rate and regular rhythm.     Heart sounds: Normal heart sounds.  Pulmonary:     Effort: Pulmonary effort is normal.     Breath sounds: Normal breath sounds. No wheezing.  Musculoskeletal:     Cervical back: Neck supple.  Lymphadenopathy:     Cervical: No cervical adenopathy.  Skin:    General: Skin is warm and dry.  Neurological:     Mental Status: She is alert and oriented to person, place, and time.    BP 104/67   Pulse 83   Ht 5\' 3"  (1.6 m)   Wt 230 lb (104.3 kg)   LMP 09/28/2021   SpO2 100%   BMI 40.74 kg/m  Wt Readings from Last 3 Encounters:  10/15/21 230 lb (104.3 kg)  09/21/21 230 lb 1.3 oz (104.4 kg)  09/16/21 236 lb (107 kg)    Health Maintenance Due  Topic Date Due   COVID-19 Vaccine (1) Never done   HIV Screening  Never done   Hepatitis C Screening  Never done   PAP SMEAR-Modifier  Never done    There are no preventive care reminders to display for this patient.   Lab Results  Component Value Date   TSH 1.04 11/10/2020   Lab Results  Component Value Date   WBC 13.0 (H) 11/10/2020   HGB 15.0 11/10/2020   HCT 44.2 11/10/2020   MCV 84.2 11/10/2020   PLT 358 11/10/2020   Lab Results   Component Value Date   NA 137 11/10/2020   K 3.5 11/10/2020   CO2 23 11/10/2020   GLUCOSE 108 (H) 11/10/2020   BUN 11 11/10/2020  CREATININE 0.74 11/10/2020   BILITOT 0.3 11/10/2020   ALKPHOS 160 (H) 02/04/2019   AST 51 (H) 11/10/2020   ALT 123 (H) 11/10/2020   PROT 7.2 11/10/2020   ALBUMIN 2.6 (L) 02/04/2019   CALCIUM 9.3 11/10/2020   ANIONGAP 7 02/04/2019   Lab Results  Component Value Date   CHOL 154 12/31/2019   Lab Results  Component Value Date   HDL 59 12/31/2019   Lab Results  Component Value Date   LDLCALC 81 12/31/2019   Lab Results  Component Value Date   TRIG 60 12/31/2019   Lab Results  Component Value Date   CHOLHDL 2.6 12/31/2019   Lab Results  Component Value Date   HGBA1C 5.6 12/31/2019       Assessment & Plan:   Problem List Items Addressed This Visit       Cardiovascular and Mediastinum   Migraine without aura and with status migrainosus, not intractable - Primary    Discussed options.  For now she would like to just try to get through until she is able to see the neurologist.  Because she is wanting to try to get pregnant I am hesitant to use certain medications such as Topamax etc.  For now we will continue with the propranolol.  And we will try Relpax instead of Imitrex to see if she feels like it is more effective.  I Ernie Hew give her a 5-day steroid taper for now to break her current headache which she has had for 2 days.  We discussed potential triggers.  Encouraged her to try to make sure she is getting adequate sleep.  She does have a 4-year-old.  Working on reducing stress levels.  Avoiding caffeine and chocolate..  Also encouraged her to keep a calendar/tracker.  There are some great apps online that can help her do this so that she can take that information to her with her appointment in January      Relevant Medications   eletriptan (RELPAX) 20 MG tablet     Meds ordered this encounter  Medications   predniSONE (DELTASONE) 10  MG tablet    Sig: Take 8 tablets (80 mg total) by mouth daily with breakfast for 1 day, THEN 6 tablets (60 mg total) daily with breakfast for 1 day, THEN 4 tablets (40 mg total) daily with breakfast for 1 day, THEN 2 tablets (20 mg total) daily with breakfast for 1 day, THEN 1 tablet (10 mg total) daily with breakfast for 1 day.    Dispense:  21 tablet    Refill:  0   eletriptan (RELPAX) 20 MG tablet    Sig: Take 1 tablet (20 mg total) by mouth as needed for migraine or headache. May repeat in 2 hours if headache persists or recurs.    Dispense:  10 tablet    Refill:  1     Nani Gasser, MD

## 2021-10-15 NOTE — Assessment & Plan Note (Signed)
Discussed options.  For now she would like to just try to get through until she is able to see the neurologist.  Because she is wanting to try to get pregnant I am hesitant to use certain medications such as Topamax etc.  For now we will continue with the propranolol.  And we will try Relpax instead of Imitrex to see if she feels like it is more effective.  I Ernie Hew give her a 5-day steroid taper for now to break her current headache which she has had for 2 days.  We discussed potential triggers.  Encouraged her to try to make sure she is getting adequate sleep.  She does have a 28-year-old.  Working on reducing stress levels.  Avoiding caffeine and chocolate..  Also encouraged her to keep a calendar/tracker.  There are some great apps online that can help her do this so that she can take that information to her with her appointment in January

## 2021-10-31 ENCOUNTER — Telehealth: Payer: BC Managed Care – PPO | Admitting: Nurse Practitioner

## 2021-10-31 ENCOUNTER — Encounter: Payer: Self-pay | Admitting: Family Medicine

## 2021-10-31 DIAGNOSIS — R6889 Other general symptoms and signs: Secondary | ICD-10-CM

## 2021-10-31 DIAGNOSIS — G43001 Migraine without aura, not intractable, with status migrainosus: Secondary | ICD-10-CM | POA: Diagnosis not present

## 2021-10-31 NOTE — Patient Instructions (Signed)
  Bailey Schwartz, thank you for joining Nicoletta Ba, NP for today's virtual visit.  While this provider is not your primary care provider (PCP), if your PCP is located in our provider database this encounter information will be shared with them immediately following your visit.  Consent: (Patient) Bailey Schwartz provided verbal consent for this virtual visit at the beginning of the encounter.  Current Medications:  Current Outpatient Medications:    cyclobenzaprine (FLEXERIL) 5 MG tablet, Take 1 tablet (5 mg total) by mouth 3 (three) times daily as needed for muscle spasms., Disp: 30 tablet, Rfl: 1   eletriptan (RELPAX) 20 MG tablet, Take 1 tablet (20 mg total) by mouth as needed for migraine or headache. May repeat in 2 hours if headache persists or recurs., Disp: 10 tablet, Rfl: 1   propranolol ER (INDERAL LA) 80 MG 24 hr capsule, Take 1 capsule (80 mg total) by mouth daily., Disp: 30 capsule, Rfl: 3   Medications ordered in this encounter:  No orders of the defined types were placed in this encounter.    *If you need refills on other medications prior to your next appointment, please contact your pharmacy*  Follow-Up: Call back or seek an in-person evaluation if the symptoms worsen or if the condition fails to improve as anticipated.  Other Instructions Follow-up with PCP on 11/01/2021   If you have been instructed to have an in-person evaluation today at a local Urgent Care facility, please use the link below. It will take you to a list of all of our available [facility], including address, phone number and hours of operation. Please do not delay care.  [facility]  If you or a family member do not have a primary care provider, use the link below to schedule a visit and establish care. When you choose a [facility] primary care physician or advanced practice provider, you gain a long-term partner in health. Find a Primary Care Provider  Learn more about  [facility]'s in-office and virtual care options: [facility] - Get Care Now

## 2021-10-31 NOTE — Progress Notes (Signed)
Virtual Visit Consent   Bailey Schwartz, you are scheduled for a virtual visit with a [facility] provider today.     Just as with appointments in the office, your consent must be obtained to participate.  Your consent will be active for this visit and any virtual visit you may have with one of our providers in the next 365 days.     If you have a MyChart account, a copy of this consent can be sent to you electronically.  All virtual visits are billed to your insurance company just like a traditional visit in the office.    As this is a virtual visit, video technology does not allow for your provider to perform a traditional examination.  This may limit your provider's ability to fully assess your condition.  If your provider identifies any concerns that need to be evaluated in person or the need to arrange testing (such as labs, EKG, etc.), we will make arrangements to do so.     Although advances in technology are sophisticated, we cannot ensure that it will always work on either your end or our end.  If the connection with a video visit is poor, the visit may have to be switched to a telephone visit.  With either a video or telephone visit, we are not always able to ensure that we have a secure connection.     I need to obtain your verbal consent now.   Are you willing to proceed with your visit today? Yes   Bailey Schwartz has provided verbal consent on 10/31/2021 for a virtual visit (video or telephone).   Nicoletta Ba, NP   Date: 10/31/2021 11:31 AM   Virtual Visit via Video Note   I, Nicoletta Ba, connected with  Bailey Schwartz  ([id number], 11/28/1986) on 10/31/21 at 11:30 AM EST by a video-enabled telemedicine application and verified that I am speaking with the correct person using two identifiers.  Location: Patient: Virtual Visit Location Patient: Home Provider: Virtual Visit Location Provider: Home   I discussed the limitations of evaluation and management by telemedicine and  the availability of in person appointments. The patient expressed understanding and agreed to proceed.    History of Present Illness: Bailey Schwartz is a 35 y.o. who identifies as a female who was assigned female at birth, and is being seen today for migraine HA. HA has been present since October. She has been on medication such as Imitrex and Propanolol, Ibuprofen. She has also received Toradol injections from the local urgent care with no relief. She has been seen at [facility] and had an MRI done on 11/4, which was normal. She has also been seen by her PCP and prescribed Relpax, but states she cannot afford the medication. She has reached out to her doctor to see if she can prescribe a more cost-effective alternative. She is scheduled to see neurology/HA specialist in January.  HPI: HPI   Problems:  Patient Active Problem List   Diagnosis Date Noted   Migraine without aura and with status migrainosus, not intractable 10/15/2021   Anxiety 12/31/2019   Abnormal weight gain 12/31/2019   History of migraine 12/31/2019   History of gestational diabetes 12/31/2019   History of pre-eclampsia 12/31/2019   Postpartum care following vaginal delivery 3/10 02/05/2019   Preeclampsia 02/05/2019   Obstetrical laceration -  2nd degree Perineal; right vaginal Sulcus;Labial majus bilateral 02/05/2019   Depression, major, single episode, 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/02/2018   Intractable periodic headache syndrome 05/02/2018  Allergies:  Allergies  Allergen Reactions   Sulfamethoxazole-Trimethoprim Hives, Swelling and Rash    Angioedema  Other reaction(s): Angioedema (ALLERGY/intolerance)   Fluconazole Hives and Rash   Medications:  Current Outpatient Medications:    cyclobenzaprine (FLEXERIL) 5 MG tablet, Take 1 tablet (5 mg total) by mouth 3 (three) times daily as needed for muscle spasms., Disp: 30 tablet, Rfl: 1   eletriptan (RELPAX) 20 MG tablet, Take 1 tablet (20 mg total) by mouth as  needed for migraine or headache. May repeat in 2 hours if headache persists or recurs., Disp: 10 tablet, Rfl: 1   propranolol ER (INDERAL LA) 80 MG 24 hr capsule, Take 1 capsule (80 mg total) by mouth daily., Disp: 30 capsule, Rfl: 3  Observations/Objective: Patient is well-developed, well-nourished in no acute distress.  Resting comfortably at home.  Head is normocephalic, atraumatic.  No labored breathing.  Speech is clear and coherent with logical content.  Patient is alert and oriented at baseline.    Assessment and Plan: 1. Migraine without aura and with status migrainosus, not intractable  Recommended following up with PCP tomorrow regarding the Relpax. Will not prescribe anything further today given her history of no resolution of HA symptoms with previous medications. Recommend continuing current medication or Ibuprofen. If symptoms worsen, recommend follow up at the local urgent care. Patient verbalized understanding, all questions answered.   Follow Up Instructions: I discussed the assessment and treatment plan with the patient. The patient was provided an opportunity to ask questions and all were answered. The patient agreed with the plan and demonstrated an understanding of the instructions.  A copy of instructions were sent to the patient via MyChart unless otherwise noted below.    The patient was advised to call back or seek an in-person evaluation if the symptoms worsen or if the condition fails to improve as anticipated.  Time:  I spent 15 minutes with the patient via telehealth technology discussing the above problems/concerns.    Nicoletta Ba, NP

## 2021-10-31 NOTE — Progress Notes (Signed)
E visit for Flu like symptoms   We are sorry that you are not feeling well.  Here is how we plan to help! Based on what you have shared with me it looks like you may have flu-like symptoms that should be watched but do not seem to indicate anti-viral treatment.  Influenza or "the flu" is   an infection caused by a respiratory virus. The flu virus is highly contagious and persons who did not receive their yearly flu vaccination may "catch" the flu from close contact.  We have anti-viral medications to treat the viruses that cause this infection. They are not a "cure" and only shorten the course of the infection. These prescriptions are most effective when they are given within the first 2 days of "flu" symptoms. Antiviral medication are indicated if you have a high risk of complications from the flu. You should  also consider an antiviral medication if you are in close contact with someone who is at risk. These medications can help patients avoid complications from the flu  but have side effects that you should know. Possible side effects from Tamiflu or oseltamivir include nausea, vomiting, diarrhea, dizziness, headaches, eye redness, sleep problems or other respiratory symptoms. You should not take Tamiflu if you have an allergy to oseltamivir or any to the ingredients in Tamiflu.  ANYONE WHO HAS FLU SYMPTOMS SHOULD: Stay home. The flu is highly contagious and going out or to work exposes others! Be sure to drink plenty of fluids. Water is fine as well as fruit juices, sodas and electrolyte beverages. You may want to stay away from caffeine or alcohol. If you are nauseated, try taking small sips of liquids. How do you know if you are getting enough fluid? Your urine should be a pale yellow or almost colorless. Get rest. Taking a steamy shower or using a humidifier may help nasal congestion and ease sore throat pain. Using a saline nasal spray works much the same way. Cough drops, hard candies and  sore throat lozenges may ease your cough. Line up a caregiver. Have someone check on you regularly.   GET HELP RIGHT AWAY IF: You cannot keep down liquids or your medications. You become short of breath Your fell like you are going to pass out or loose consciousness. Your symptoms persist after you have completed your treatment plan MAKE SURE YOU  Understand these instructions. Will watch your condition. Will get help right away if you are not doing well or get worse.  Your e-visit answers were reviewed by a board certified advanced clinical practitioner to complete your personal care plan.  Depending on the condition, your plan could have included both over the counter or prescription medications.  If there is a problem please reply  once you have received a response from your provider.  Your safety is important to [place].  If you have drug allergies check your prescription carefully.    You can use MyChart to ask questions about today's visit, request a non-urgent call back, or ask for a work or school excuse for 24 hours related to this e-Visit. If it has been greater than 24 hours you will need to follow up with your provider, or enter a new e-Visit to address those concerns.  You will get an e-mail in the next two days asking about your experience.  I hope that your e-visit has been valuable and will speed your recovery. Thank you for using e-visits.   I have spent at least 5  minutes reviewing and documenting in the patient's chart.

## 2021-11-02 MED ORDER — RIZATRIPTAN BENZOATE 5 MG PO TABS: 5.0000 mg | ORAL_TABLET | ORAL | 0 refills | Status: DC | PRN

## 2021-11-02 NOTE — Telephone Encounter (Signed)
See if prednisone taper helped at all. Will send rx for maxalt to see if cheaper. She may want to check on good Rx too

## 2021-11-03 MED ORDER — TOPIRAMATE 25 MG PO TABS: ORAL_TABLET | ORAL | 0 refills | Status: DC

## 2021-11-03 NOTE — Telephone Encounter (Signed)
Okay, I also like to put her on topiramate which is also called Topamax.  Its to help prevent migraines.  The other 1 is to take if she already has a migraine.  But I really like for her to start the topiramate follow the instructions on the bottle.  If she is tolerating it well then we can send in an updated prescription in 1 month and then have her follow-up in January for her migraines to see if this is helping.

## 2021-11-20 ENCOUNTER — Other Ambulatory Visit: Payer: Self-pay | Admitting: Family Medicine

## 2021-11-23 ENCOUNTER — Ambulatory Visit (INDEPENDENT_AMBULATORY_CARE_PROVIDER_SITE_OTHER): Payer: BC Managed Care – PPO | Admitting: Family Medicine

## 2021-11-23 ENCOUNTER — Other Ambulatory Visit: Payer: Self-pay

## 2021-11-23 ENCOUNTER — Encounter: Payer: Self-pay | Admitting: Family Medicine

## 2021-11-23 VITALS — BP 93/60 | HR 80 | Ht 63.0 in | Wt 234.0 lb

## 2021-11-23 DIAGNOSIS — G43C1 Periodic headache syndromes in child or adult, intractable: Secondary | ICD-10-CM

## 2021-11-23 DIAGNOSIS — F419 Anxiety disorder, unspecified: Secondary | ICD-10-CM | POA: Diagnosis not present

## 2021-11-23 DIAGNOSIS — G43001 Migraine without aura, not intractable, with status migrainosus: Secondary | ICD-10-CM | POA: Diagnosis not present

## 2021-11-23 MED ORDER — KETOROLAC TROMETHAMINE 60 MG/2ML IM SOLN
60.0000 mg | Freq: Once | INTRAMUSCULAR | Status: AC
  Administered 2021-11-23: 16:00:00 60 mg via INTRAMUSCULAR

## 2021-11-23 MED ORDER — DEXAMETHASONE SODIUM PHOSPHATE 10 MG/ML IJ SOLN
10.0000 mg | Freq: Once | INTRAMUSCULAR | Status: AC
  Administered 2021-11-23: 16:00:00 10 mg via INTRAMUSCULAR

## 2021-11-26 ENCOUNTER — Encounter: Payer: Self-pay | Admitting: Family Medicine

## 2021-11-26 MED ORDER — PREDNISONE 10 MG (21) PO TBPK: ORAL_TABLET | ORAL | 0 refills | Status: DC

## 2021-11-28 ENCOUNTER — Encounter: Payer: Self-pay | Admitting: Family Medicine

## 2021-11-28 NOTE — Assessment & Plan Note (Signed)
Worsening anxiety.  Referral placed to psychiatry.

## 2021-11-28 NOTE — Progress Notes (Signed)
Bailey Schwartz - 36 y.o. female MRN [id number]  Date of birth: 01-31-1986  Subjective No chief complaint on file.   HPI Bailey Schwartz is a 36 year old female here today for follow-up visit.  She has history of migraines and has had worsening anxiety.  She does have migraine currently and has not responded to her typical Maxalt.  She denies any new neurological changes, fever or chills.  ROS:  A comprehensive ROS was completed and negative except as noted per HPI  Allergies  Allergen Reactions   Sulfamethoxazole-Trimethoprim Hives, Swelling and Rash    Angioedema  Other reaction(s): Angioedema (ALLERGY/intolerance)   Fluconazole Hives and Rash    Past Medical History:  Diagnosis Date   Depression    Genital warts    Gestational diabetes    History of abnormal cervical Pap smear    Hx of HPV   History of chicken pox    Migraine    Preeclampsia     Past Surgical History:  Procedure Laterality Date   REFRACTIVE SURGERY      Social History   Socioeconomic History   Marital status: Single    Spouse name: Not on file   Number of children: 1   Years of education: Not on file   Highest education level: Not on file  Occupational History    Employer: [employer]  Tobacco Use   Smoking status: Former    Packs/day: 0.18    Types: Cigarettes   Smokeless tobacco: Never  Vaping Use   Vaping Use: Never used  Substance and Sexual Activity   Alcohol use: Not Currently   Drug use: Not Currently   Sexual activity: Not on file  Other Topics Concern   Not on file  Social History Narrative   Not on file   Social Determinants of Health   Financial Resource Strain: Not on file  Food Insecurity: Not on file  Transportation Needs: Not on file  Physical Activity: Not on file  Stress: Not on file  Social Connections: Not on file    Family History  Problem Relation Age of Onset   Miscarriages / Stillbirths Mother    Hypertension Mother    Diabetes  Mother    Esophageal cancer Maternal Grandmother    Hyperlipidemia Maternal Grandmother    Hypertension Maternal Grandmother    Alcohol abuse Maternal Grandfather    Hyperlipidemia Paternal Grandmother    Hypertension Paternal Grandmother    Hyperlipidemia Paternal Grandfather    Hypertension Paternal Grandfather    Liver cancer Paternal Grandfather    Stroke Paternal Grandfather     Health Maintenance  Topic Date Due   COVID-19 Vaccine (1) Never done   HIV Screening  Never done   Hepatitis C Screening  Never done   PAP SMEAR-Modifier  Never done   INFLUENZA VACCINE  02/25/2022 (Originally 06/28/2021)   TETANUS/TDAP  02/04/2029   Pneumococcal Vaccine 61-79 Years old  Aged Out   HPV VACCINES  Aged Out     ----------------------------------------------------------------------------------------------------------------------------------------------------------------------------------------------------------------- Physical Exam BP 93/60    Pulse 80    Ht 5\' 3"  (1.6 m)    Wt 234 lb (106.1 kg)    SpO2 99%    BMI 41.45 kg/m   Physical Exam Constitutional:      Appearance: Normal appearance.  Eyes:     General: No scleral icterus. Cardiovascular:     Rate and Rhythm: Normal rate and regular rhythm.  Pulmonary:     Effort: Pulmonary effort is normal.  Breath sounds: Normal breath sounds.  Musculoskeletal:     Cervical back: Neck supple.  Neurological:     General: No focal deficit present.     Mental Status: She is alert.  Psychiatric:        Mood and Affect: Mood normal.        Behavior: Behavior normal.    ------------------------------------------------------------------------------------------------------------------------------------------------------------------------------------------------------------------- Assessment and Plan  Intractable periodic headache syndrome Given injection of Toradol and Decadron today.  She may continue Maxalt as  needed.  Anxiety Worsening anxiety.  Referral placed to psychiatry.   Meds ordered this encounter  Medications   ketorolac (TORADOL) injection 60 mg   dexamethasone (DECADRON) injection 10 mg    No follow-ups on file.    This visit occurred during the SARS-CoV-2 public health emergency.  Safety protocols were in place, including screening questions prior to the visit, additional usage of staff PPE, and extensive cleaning of exam room while observing appropriate contact time as indicated for disinfecting solutions.

## 2021-11-28 NOTE — Assessment & Plan Note (Signed)
Given injection of Toradol and Decadron today.  She may continue Maxalt as needed.

## 2021-12-01 DIAGNOSIS — G43719 Chronic migraine without aura, intractable, without status migrainosus: Secondary | ICD-10-CM | POA: Diagnosis not present

## 2021-12-01 DIAGNOSIS — Z049 Encounter for examination and observation for unspecified reason: Secondary | ICD-10-CM | POA: Diagnosis not present

## 2021-12-01 DIAGNOSIS — G43839 Menstrual migraine, intractable, without status migrainosus: Secondary | ICD-10-CM | POA: Diagnosis not present

## 2021-12-01 DIAGNOSIS — Z79899 Other long term (current) drug therapy: Secondary | ICD-10-CM | POA: Diagnosis not present

## 2021-12-27 ENCOUNTER — Telehealth (INDEPENDENT_AMBULATORY_CARE_PROVIDER_SITE_OTHER): Payer: BC Managed Care – PPO | Admitting: Psychiatry

## 2021-12-27 ENCOUNTER — Encounter (HOSPITAL_COMMUNITY): Payer: Self-pay | Admitting: Psychiatry

## 2021-12-27 DIAGNOSIS — F411 Generalized anxiety disorder: Secondary | ICD-10-CM

## 2021-12-27 DIAGNOSIS — F341 Dysthymic disorder: Secondary | ICD-10-CM
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
## 2022-01-03 ENCOUNTER — Institutional Professional Consult (permissible substitution): Payer: BC Managed Care – PPO | Admitting: Pulmonary Disease

## 2022-01-10 ENCOUNTER — Other Ambulatory Visit: Payer: Self-pay | Admitting: Family Medicine

## 2022-01-11 ENCOUNTER — Institutional Professional Consult (permissible substitution): Payer: BC Managed Care – PPO | Admitting: Pulmonary Disease

## 2022-01-31 ENCOUNTER — Other Ambulatory Visit: Payer: Self-pay

## 2022-01-31 ENCOUNTER — Telehealth (HOSPITAL_COMMUNITY): Payer: BC Managed Care – PPO | Admitting: Psychiatry

## 2022-02-22 LAB — OB RESULTS CONSOLE RPR: RPR: NONREACTIVE

## 2022-02-22 LAB — HEPATITIS C ANTIBODY: HCV Ab: NEGATIVE

## 2022-02-22 LAB — OB RESULTS CONSOLE HIV ANTIBODY (ROUTINE TESTING): HIV: NONREACTIVE

## 2022-02-22 LAB — OB RESULTS CONSOLE HEPATITIS B SURFACE ANTIGEN: Hepatitis B Surface Ag: NEGATIVE

## 2022-03-01 LAB — OB RESULTS CONSOLE GC/CHLAMYDIA
Chlamydia: NEGATIVE
Neisseria Gonorrhea: NEGATIVE

## 2022-03-01 LAB — OB RESULTS CONSOLE ABO/RH: RH Type: POSITIVE

## 2022-03-01 LAB — OB RESULTS CONSOLE RUBELLA ANTIBODY, IGM: Rubella: IMMUNE

## 2022-03-01 LAB — OB RESULTS CONSOLE ANTIBODY SCREEN: Antibody Screen: NEGATIVE
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
## 2022-08-24 LAB — OB RESULTS CONSOLE GBS: GBS: NEGATIVE

## 2022-09-12 ENCOUNTER — Telehealth (HOSPITAL_COMMUNITY): Payer: Self-pay | Admitting: *Deleted

## 2022-09-12 ENCOUNTER — Encounter (HOSPITAL_COMMUNITY): Payer: Self-pay | Admitting: *Deleted

## 2022-09-12 ENCOUNTER — Encounter (HOSPITAL_COMMUNITY): Payer: Self-pay

## 2022-09-12 NOTE — Telephone Encounter (Signed)
Preadmission screen  

## 2022-09-13 ENCOUNTER — Encounter (HOSPITAL_COMMUNITY): Payer: Self-pay | Admitting: *Deleted

## 2022-09-13 ENCOUNTER — Telehealth (HOSPITAL_COMMUNITY): Payer: Self-pay | Admitting: *Deleted

## 2022-09-13 NOTE — Telephone Encounter (Signed)
Hello [facility]   I am trying to reach you to review your Medical History and answer any questions you have regarding your Induction of Labor.  My number is [phone].  I am in the office from 8:30-4:30 Monday thru Friday.    Teresa Coombs MSN [facility] Preadmission Nurse Preadmission screen

## 2022-09-17 ENCOUNTER — Encounter (HOSPITAL_COMMUNITY): Payer: Self-pay | Admitting: Obstetrics and Gynecology

## 2022-09-17 ENCOUNTER — Inpatient Hospital Stay (HOSPITAL_COMMUNITY): Payer: Medicaid Other

## 2022-09-17 ENCOUNTER — Other Ambulatory Visit: Payer: Self-pay

## 2022-09-17 ENCOUNTER — Inpatient Hospital Stay (HOSPITAL_COMMUNITY)
Admission: AD | Admit: 2022-09-17 | Discharge: 2022-09-20 | DRG: 788 | Disposition: A | Discharge status: Home / Self Care | Payer: Medicaid Other | Attending: Obstetrics and Gynecology | Admitting: Obstetrics and Gynecology

## 2022-09-17 DIAGNOSIS — O24420 Gestational diabetes mellitus in childbirth, diet controlled: Secondary | ICD-10-CM | POA: Diagnosis present

## 2022-09-17 DIAGNOSIS — O24429 Gestational diabetes mellitus in childbirth, unspecified control: Secondary | ICD-10-CM | POA: Diagnosis not present

## 2022-09-17 DIAGNOSIS — O9902 Anemia complicating childbirth: Secondary | ICD-10-CM | POA: Diagnosis present

## 2022-09-17 DIAGNOSIS — Z87891 Personal history of nicotine dependence: Secondary | ICD-10-CM

## 2022-09-17 DIAGNOSIS — O99214 Obesity complicating childbirth: Secondary | ICD-10-CM | POA: Diagnosis present

## 2022-09-17 DIAGNOSIS — O24410 Gestational diabetes mellitus in pregnancy, diet controlled: Principal | ICD-10-CM | POA: Diagnosis present

## 2022-09-17 DIAGNOSIS — Z3A29 29 weeks gestation of pregnancy: Secondary | ICD-10-CM | POA: Diagnosis not present

## 2022-09-17 DIAGNOSIS — O164 Unspecified maternal hypertension, complicating childbirth: Secondary | ICD-10-CM | POA: Diagnosis not present

## 2022-09-17 DIAGNOSIS — Z3A39 39 weeks gestation of pregnancy: Secondary | ICD-10-CM | POA: Diagnosis not present

## 2022-09-17 HISTORY — DX: Gestational diabetes mellitus in pregnancy, diet controlled: O24.410

## 2022-09-17 LAB — CBC
HCT: 33 % — ABNORMAL LOW (ref 36.0–46.0)
Hemoglobin: 10.8 g/dL — ABNORMAL LOW (ref 12.0–15.0)
MCH: 26.8 pg (ref 26.0–34.0)
MCHC: 32.7 g/dL (ref 30.0–36.0)
MCV: 81.9 fL (ref 80.0–100.0)
Platelets: 300 10*3/uL (ref 150–400)
RBC: 4.03 MIL/uL (ref 3.87–5.11)
RDW: 14.4 % (ref 11.5–15.5)
WBC: 7.7 10*3/uL (ref 4.0–10.5)
nRBC: 0 % (ref 0.0–0.2)

## 2022-09-17 LAB — TYPE AND SCREEN
ABO/RH(D): O POS
Antibody Screen: NEGATIVE

## 2022-09-17 LAB — GLUCOSE, CAPILLARY
Glucose-Capillary: 74 mg/dL (ref 70–99)
Glucose-Capillary: 91 mg/dL (ref 70–99)

## 2022-09-17 MED ORDER — ONDANSETRON HCL 4 MG/2ML IJ SOLN: 4.0000 mg | Freq: Four times a day (QID) | INTRAMUSCULAR | Status: DC | PRN

## 2022-09-17 MED ORDER — SOD CITRATE-CITRIC ACID 500-334 MG/5ML PO SOLN: 30.0000 mL | ORAL | Status: DC | PRN

## 2022-09-17 MED ORDER — LACTATED RINGERS IV SOLN
500.0000 mL | INTRAVENOUS | Status: DC | PRN
  Administered 2022-09-18: 500 mL via INTRAVENOUS
  Administered 2022-09-18: 1000 mL via INTRAVENOUS

## 2022-09-17 MED ORDER — MISOPROSTOL 50MCG HALF TABLET
50.0000 ug | ORAL_TABLET | ORAL | Status: DC
  Administered 2022-09-17: 50 ug via ORAL
  Filled 2022-09-17: qty 1

## 2022-09-17 MED ORDER — LIDOCAINE HCL (PF) 1 % IJ SOLN: 30.0000 mL | INTRAMUSCULAR | Status: DC | PRN

## 2022-09-17 MED ORDER — LACTATED RINGERS IV SOLN: INTRAVENOUS | Status: DC

## 2022-09-17 MED ORDER — OXYTOCIN 10 UNIT/ML IJ SOLN: 10.0000 [IU] | Freq: Once | INTRAMUSCULAR | Status: DC

## 2022-09-17 MED ORDER — MISOPROSTOL 25 MCG QUARTER TABLET
25.0000 ug | ORAL_TABLET | Freq: Once | ORAL | Status: AC
  Administered 2022-09-17: 25 ug via VAGINAL
  Filled 2022-09-17: qty 1

## 2022-09-17 MED ORDER — MISOPROSTOL 25 MCG QUARTER TABLET
25.0000 ug | ORAL_TABLET | ORAL | Status: DC
  Administered 2022-09-17: 25 ug via VAGINAL
  Filled 2022-09-17 (×2): qty 1

## 2022-09-17 MED ORDER — OXYCODONE-ACETAMINOPHEN 5-325 MG PO TABS: 2.0000 | ORAL_TABLET | ORAL | Status: DC | PRN

## 2022-09-17 MED ORDER — MISOPROSTOL 50MCG HALF TABLET
50.0000 ug | ORAL_TABLET | Freq: Once | ORAL | Status: AC
  Administered 2022-09-17: 50 ug via ORAL
  Filled 2022-09-17: qty 1

## 2022-09-17 MED ORDER — ACETAMINOPHEN 325 MG PO TABS: 650.0000 mg | ORAL_TABLET | ORAL | Status: DC | PRN

## 2022-09-17 MED ORDER — OXYCODONE-ACETAMINOPHEN 5-325 MG PO TABS: 1.0000 | ORAL_TABLET | ORAL | Status: DC | PRN

## 2022-09-17 MED ORDER — TERBUTALINE SULFATE 1 MG/ML IJ SOLN
0.2500 mg | Freq: Once | INTRAMUSCULAR | Status: AC | PRN
  Administered 2022-09-18: 0.25 mg via SUBCUTANEOUS
  Filled 2022-09-17: qty 1

## 2022-09-17 MED ORDER — OXYTOCIN BOLUS FROM INFUSION: 333.0000 mL | Freq: Once | INTRAVENOUS | Status: DC

## 2022-09-17 MED ORDER — OXYTOCIN-SODIUM CHLORIDE 30-0.9 UT/500ML-% IV SOLN: 2.5000 [IU]/h | INTRAVENOUS | Status: DC

## 2022-09-17 NOTE — H&P (Signed)
Bailey Schwartz is a 36 y.o. female presenting for  IOL @ 39 wk due to Class A1 GDM. OB History     Gravida  2   Para  1   Term  1   Preterm      AB      Living  1      SAB      IAB      Ectopic      Multiple  0   Live Births  1          Past Medical History:  Diagnosis Date   Depression    Genital warts    Gestational diabetes    History of abnormal cervical Pap smear    Hx of HPV   History of chicken pox    Migraine    Preeclampsia    Vaginal Pap smear, abnormal    Past Surgical History:  Procedure Laterality Date   REFRACTIVE SURGERY     Family History: family history includes Aneurysm in her maternal grandfather; Diabetes in her mother; Esophageal cancer in her maternal grandmother; Hyperlipidemia in her maternal grandmother, paternal grandfather, and paternal grandmother; Hypertension in her maternal grandmother, mother, paternal grandfather, and paternal grandmother; Liver cancer in her maternal grandfather; Miscarriages / [place] in her mother; Stroke in her paternal grandfather. Social History:  reports that she has quit smoking. Her smoking use included cigarettes. She smoked an average of .18 packs per day. She has never used smokeless tobacco. She reports that she does not currently use alcohol. She reports that she does not currently use drugs.     Maternal Diabetes: Yes:  Diabetes Type:  Diet controlled Genetic Screening: Normal Maternal Ultrasounds/Referrals: Normal Fetal Ultrasounds or other Referrals:  None Maternal Substance Abuse:  No Significant Maternal Medications:  Meds include: Other: baby ASA Significant Maternal Lab Results:  Group B Strep negative Number of Prenatal Visits:greater than 3 verified prenatal visits Other Comments:   persistent elevated fasting BS . Hx preeclampsia  Review of Systems  All other systems reviewed and are negative.  History Dilation: Fingertip Effacement (%): Thick Station: Ballotable Exam by::  Mirant RN Blood pressure 122/69, pulse 89, resp. rate 20, height 5\' 3"  (1.6 m), weight 123 kg, last menstrual period 09/28/2021, unknown if currently breastfeeding. Exam Physical Exam Constitutional:      Appearance: Normal appearance.  HENT:     Head: Atraumatic.  Eyes:     Extraocular Movements: Extraocular movements intact.  Cardiovascular:     Rate and Rhythm: Regular rhythm.     Heart sounds: Normal heart sounds.  Pulmonary:     Breath sounds: Normal breath sounds.  Abdominal:     Comments: gravid  Musculoskeletal:        General: Normal range of motion.     Cervical back: Neck supple.  Skin:    General: Skin is warm and dry.  Neurological:     General: No focal deficit present.     Mental Status: She is alert and oriented to person, place, and time.  Psychiatric:        Mood and Affect: Mood normal.        Behavior: Behavior normal.     Prenatal labs: ABO, Rh: --/--/O POS (10/21 1858) Antibody: NEG (10/21 1858) Rubella: Immune (04/04 0000) RPR: Nonreactive (03/28 0000)  HBsAg: Negative (03/28 0000)  HIV: Non-reactive (03/28 0000)  GBS: Negative/-- (09/27 0000)   Assessment/Plan: Class A1 GDM Term gestation P) admit routine labs cervical  ripening . CBG q 4 hrs   Bailey Schwartz A Bailey Schwartz 09/17/2022, 9:38 PM

## 2022-09-18 ENCOUNTER — Encounter (HOSPITAL_COMMUNITY)
Admission: AD | Disposition: A | Discharge status: Home / Self Care | Payer: Self-pay | Source: Home / Self Care | Attending: Obstetrics and Gynecology

## 2022-09-18 ENCOUNTER — Encounter (HOSPITAL_COMMUNITY): Payer: Self-pay | Admitting: Obstetrics and Gynecology

## 2022-09-18 ENCOUNTER — Inpatient Hospital Stay (HOSPITAL_COMMUNITY): Payer: Medicaid Other | Admitting: Anesthesiology

## 2022-09-18 DIAGNOSIS — O24429 Gestational diabetes mellitus in childbirth, unspecified control: Secondary | ICD-10-CM

## 2022-09-18 DIAGNOSIS — Z3A29 29 weeks gestation of pregnancy: Secondary | ICD-10-CM

## 2022-09-18 DIAGNOSIS — O164 Unspecified maternal hypertension, complicating childbirth: Secondary | ICD-10-CM

## 2022-09-18 LAB — GLUCOSE, CAPILLARY: Glucose-Capillary: 79 mg/dL (ref 70–99)

## 2022-09-18 LAB — RPR: RPR Ser Ql: NONREACTIVE

## 2022-09-18 SURGERY — Surgical Case
Anesthesia: Epidural | Wound class: Clean Contaminated

## 2022-09-18 MED ORDER — STERILE WATER FOR IRRIGATION IR SOLN
Status: DC | PRN
  Administered 2022-09-18: 1000 mL

## 2022-09-18 MED ORDER — NALOXONE HCL 0.4 MG/ML IJ SOLN: 0.4000 mg | INTRAMUSCULAR | Status: DC | PRN

## 2022-09-18 MED ORDER — LIDOCAINE-EPINEPHRINE (PF) 2 %-1:200000 IJ SOLN
INTRAMUSCULAR | Status: DC | PRN
  Administered 2022-09-18: 5 mL via EPIDURAL
  Administered 2022-09-18: 10 mL via EPIDURAL

## 2022-09-18 MED ORDER — ONDANSETRON HCL 4 MG/2ML IJ SOLN
INTRAMUSCULAR | Status: DC | PRN
  Administered 2022-09-18: 4 mg via INTRAVENOUS

## 2022-09-18 MED ORDER — NALOXONE HCL 4 MG/10ML IJ SOLN: 1.0000 ug/kg/h | INTRAVENOUS | Status: DC | PRN

## 2022-09-18 MED ORDER — KETOROLAC TROMETHAMINE 30 MG/ML IJ SOLN: 30.0000 mg | Freq: Four times a day (QID) | INTRAMUSCULAR | Status: AC | PRN

## 2022-09-18 MED ORDER — DIPHENHYDRAMINE HCL 50 MG/ML IJ SOLN: 12.5000 mg | INTRAMUSCULAR | Status: DC | PRN

## 2022-09-18 MED ORDER — FENTANYL-BUPIVACAINE-NACL 0.5-0.125-0.9 MG/250ML-% EP SOLN
EPIDURAL | Status: DC | PRN
  Administered 2022-09-18: 12 mL/h via EPIDURAL

## 2022-09-18 MED ORDER — DEXAMETHASONE SODIUM PHOSPHATE 4 MG/ML IJ SOLN
INTRAMUSCULAR | Status: AC
  Filled 2022-09-18: qty 1

## 2022-09-18 MED ORDER — HYDROMORPHONE HCL 1 MG/ML IJ SOLN: 0.2500 mg | INTRAMUSCULAR | Status: DC | PRN

## 2022-09-18 MED ORDER — ZOLPIDEM TARTRATE 5 MG PO TABS: 5.0000 mg | ORAL_TABLET | Freq: Every evening | ORAL | Status: DC | PRN

## 2022-09-18 MED ORDER — PHENYLEPHRINE 80 MCG/ML (10ML) SYRINGE FOR IV PUSH (FOR BLOOD PRESSURE SUPPORT)
PREFILLED_SYRINGE | INTRAVENOUS | Status: AC
  Filled 2022-09-18: qty 20

## 2022-09-18 MED ORDER — ACETAMINOPHEN 500 MG PO TABS
1000.0000 mg | ORAL_TABLET | Freq: Four times a day (QID) | ORAL | Status: DC
  Administered 2022-09-18 – 2022-09-20 (×7): 1000 mg via ORAL
  Filled 2022-09-18 (×7): qty 2

## 2022-09-18 MED ORDER — OXYTOCIN-SODIUM CHLORIDE 30-0.9 UT/500ML-% IV SOLN
INTRAVENOUS | Status: AC
  Filled 2022-09-18: qty 500

## 2022-09-18 MED ORDER — MORPHINE SULFATE (PF) 0.5 MG/ML IJ SOLN
INTRAMUSCULAR | Status: DC | PRN
  Administered 2022-09-18: 3 mg via EPIDURAL

## 2022-09-18 MED ORDER — MENTHOL 3 MG MT LOZG: 1.0000 | LOZENGE | OROMUCOSAL | Status: DC | PRN

## 2022-09-18 MED ORDER — ZOLPIDEM TARTRATE 5 MG PO TABS
5.0000 mg | ORAL_TABLET | Freq: Once | ORAL | Status: DC
  Filled 2022-09-18: qty 1

## 2022-09-18 MED ORDER — SIMETHICONE 80 MG PO CHEW: 80.0000 mg | CHEWABLE_TABLET | ORAL | Status: DC | PRN

## 2022-09-18 MED ORDER — EPHEDRINE 5 MG/ML INJ
10.0000 mg | INTRAVENOUS | Status: DC | PRN
  Filled 2022-09-18: qty 5

## 2022-09-18 MED ORDER — PHENYLEPHRINE HCL (PRESSORS) 10 MG/ML IV SOLN
INTRAVENOUS | Status: DC | PRN
  Administered 2022-09-18: 160 ug via INTRAVENOUS
  Administered 2022-09-18: 80 ug via INTRAVENOUS
  Administered 2022-09-18: 160 ug via INTRAVENOUS
  Administered 2022-09-18: 80 ug via INTRAVENOUS

## 2022-09-18 MED ORDER — MORPHINE SULFATE (PF) 0.5 MG/ML IJ SOLN
INTRAMUSCULAR | Status: AC
  Filled 2022-09-18: qty 10

## 2022-09-18 MED ORDER — ACETAMINOPHEN 500 MG PO TABS: 1000.0000 mg | ORAL_TABLET | Freq: Four times a day (QID) | ORAL | Status: DC

## 2022-09-18 MED ORDER — ONDANSETRON HCL 4 MG/2ML IJ SOLN: 4.0000 mg | Freq: Three times a day (TID) | INTRAMUSCULAR | Status: DC | PRN

## 2022-09-18 MED ORDER — OXYTOCIN-SODIUM CHLORIDE 30-0.9 UT/500ML-% IV SOLN: 2.5000 [IU]/h | INTRAVENOUS | Status: AC

## 2022-09-18 MED ORDER — FENTANYL CITRATE (PF) 100 MCG/2ML IJ SOLN
INTRAMUSCULAR | Status: DC | PRN
  Administered 2022-09-18: 100 ug via INTRAVENOUS

## 2022-09-18 MED ORDER — SODIUM CHLORIDE 0.9% FLUSH: 3.0000 mL | INTRAVENOUS | Status: DC | PRN

## 2022-09-18 MED ORDER — OXYCODONE HCL 5 MG PO TABS
5.0000 mg | ORAL_TABLET | ORAL | Status: DC | PRN
  Administered 2022-09-18: 10 mg via ORAL
  Filled 2022-09-18: qty 2

## 2022-09-18 MED ORDER — ONDANSETRON HCL 4 MG/2ML IJ SOLN
INTRAMUSCULAR | Status: AC
  Filled 2022-09-18: qty 2

## 2022-09-18 MED ORDER — KETOROLAC TROMETHAMINE 30 MG/ML IJ SOLN: 30.0000 mg | Freq: Once | INTRAMUSCULAR | Status: DC | PRN

## 2022-09-18 MED ORDER — ONDANSETRON HCL 4 MG/2ML IJ SOLN: 4.0000 mg | Freq: Once | INTRAMUSCULAR | Status: DC | PRN

## 2022-09-18 MED ORDER — AMMONIA AROMATIC IN INHA
RESPIRATORY_TRACT | Status: AC
  Filled 2022-09-18: qty 10

## 2022-09-18 MED ORDER — LIDOCAINE HCL (PF) 1 % IJ SOLN
INTRAMUSCULAR | Status: DC | PRN
  Administered 2022-09-18: 2 mL via EPIDURAL
  Administered 2022-09-18: 10 mL via EPIDURAL

## 2022-09-18 MED ORDER — MEPERIDINE HCL 25 MG/ML IJ SOLN: 6.2500 mg | INTRAMUSCULAR | Status: DC | PRN

## 2022-09-18 MED ORDER — DEXAMETHASONE SODIUM PHOSPHATE 4 MG/ML IJ SOLN
INTRAMUSCULAR | Status: DC | PRN
  Administered 2022-09-18: 4 mg via INTRAVENOUS

## 2022-09-18 MED ORDER — SIMETHICONE 80 MG PO CHEW
80.0000 mg | CHEWABLE_TABLET | Freq: Three times a day (TID) | ORAL | Status: DC
  Administered 2022-09-18 – 2022-09-19 (×5): 80 mg via ORAL
  Filled 2022-09-18 (×6): qty 1

## 2022-09-18 MED ORDER — DIPHENHYDRAMINE HCL 25 MG PO CAPS: 25.0000 mg | ORAL_CAPSULE | ORAL | Status: DC | PRN

## 2022-09-18 MED ORDER — OXYCODONE HCL 5 MG/5ML PO SOLN: 5.0000 mg | Freq: Once | ORAL | Status: DC | PRN

## 2022-09-18 MED ORDER — PRENATAL MULTIVITAMIN CH
1.0000 | ORAL_TABLET | Freq: Every day | ORAL | Status: DC
  Administered 2022-09-18 – 2022-09-19 (×2): 1 via ORAL
  Filled 2022-09-18 (×2): qty 1

## 2022-09-18 MED ORDER — PHENYLEPHRINE 80 MCG/ML (10ML) SYRINGE FOR IV PUSH (FOR BLOOD PRESSURE SUPPORT)
80.0000 ug | PREFILLED_SYRINGE | INTRAVENOUS | Status: AC | PRN
  Administered 2022-09-18 (×3): 80 ug via INTRAVENOUS
  Filled 2022-09-18 (×2): qty 10

## 2022-09-18 MED ORDER — AMISULPRIDE (ANTIEMETIC) 5 MG/2ML IV SOLN: 10.0000 mg | Freq: Once | INTRAVENOUS | Status: DC | PRN

## 2022-09-18 MED ORDER — DIBUCAINE (PERIANAL) 1 % EX OINT: 1.0000 | TOPICAL_OINTMENT | CUTANEOUS | Status: DC | PRN

## 2022-09-18 MED ORDER — EPHEDRINE 5 MG/ML INJ
10.0000 mg | INTRAVENOUS | Status: AC | PRN
  Administered 2022-09-18 (×2): 10 mg via INTRAVENOUS

## 2022-09-18 MED ORDER — OXYCODONE HCL 5 MG PO TABS: 5.0000 mg | ORAL_TABLET | Freq: Once | ORAL | Status: DC | PRN

## 2022-09-18 MED ORDER — LACTATED RINGERS IV SOLN
500.0000 mL | Freq: Once | INTRAVENOUS | Status: AC
  Administered 2022-09-18: 500 mL via INTRAVENOUS

## 2022-09-18 MED ORDER — FENTANYL CITRATE (PF) 100 MCG/2ML IJ SOLN
INTRAMUSCULAR | Status: AC
  Filled 2022-09-18: qty 2

## 2022-09-18 MED ORDER — DEXTROSE 5 % IV SOLN
INTRAVENOUS | Status: DC | PRN
  Administered 2022-09-18: 3 g via INTRAVENOUS

## 2022-09-18 MED ORDER — LACTATED RINGERS IV SOLN: INTRAVENOUS | Status: DC | PRN

## 2022-09-18 MED ORDER — LACTATED RINGERS IV SOLN: INTRAVENOUS | Status: DC

## 2022-09-18 MED ORDER — OXYTOCIN-SODIUM CHLORIDE 30-0.9 UT/500ML-% IV SOLN
INTRAVENOUS | Status: DC | PRN
  Administered 2022-09-18: 30 [IU] via INTRAVENOUS

## 2022-09-18 MED ORDER — CEFAZOLIN IN SODIUM CHLORIDE 3-0.9 GM/100ML-% IV SOLN
INTRAVENOUS | Status: AC
  Filled 2022-09-18: qty 100

## 2022-09-18 MED ORDER — PHENYLEPHRINE 80 MCG/ML (10ML) SYRINGE FOR IV PUSH (FOR BLOOD PRESSURE SUPPORT)
80.0000 ug | PREFILLED_SYRINGE | INTRAVENOUS | Status: DC | PRN
  Administered 2022-09-18 (×2): 80 ug via INTRAVENOUS
  Filled 2022-09-18: qty 10

## 2022-09-18 MED ORDER — IBUPROFEN 600 MG PO TABS
600.0000 mg | ORAL_TABLET | Freq: Four times a day (QID) | ORAL | Status: DC
  Administered 2022-09-18 – 2022-09-20 (×7): 600 mg via ORAL
  Filled 2022-09-18 (×7): qty 1

## 2022-09-18 MED ORDER — SODIUM CHLORIDE 0.9 % IR SOLN
Status: DC | PRN
  Administered 2022-09-18: 1

## 2022-09-18 MED ORDER — DIPHENHYDRAMINE HCL 25 MG PO CAPS: 25.0000 mg | ORAL_CAPSULE | Freq: Four times a day (QID) | ORAL | Status: DC | PRN

## 2022-09-18 MED ORDER — SODIUM CHLORIDE 0.9 % IV SOLN: INTRAVENOUS | Status: DC | PRN

## 2022-09-18 MED ORDER — TERBUTALINE SULFATE 1 MG/ML IJ SOLN
INTRAMUSCULAR | Status: AC
  Administered 2022-09-18: 0.25 mg via SUBCUTANEOUS
  Filled 2022-09-18: qty 1

## 2022-09-18 MED ORDER — FENTANYL-BUPIVACAINE-NACL 0.5-0.125-0.9 MG/250ML-% EP SOLN
12.0000 mL/h | EPIDURAL | Status: DC | PRN
  Filled 2022-09-18: qty 250

## 2022-09-18 MED ORDER — WITCH HAZEL-GLYCERIN EX PADS: 1.0000 | MEDICATED_PAD | CUTANEOUS | Status: DC | PRN

## 2022-09-18 MED ORDER — OXYTOCIN-SODIUM CHLORIDE 30-0.9 UT/500ML-% IV SOLN: 1.0000 m[IU]/min | INTRAVENOUS | Status: DC

## 2022-09-18 MED ORDER — COCONUT OIL OIL: 1.0000 | TOPICAL_OIL | Status: DC | PRN

## 2022-09-18 MED ORDER — SCOPOLAMINE 1 MG/3DAYS TD PT72
1.0000 | MEDICATED_PATCH | Freq: Once | TRANSDERMAL | Status: DC
  Administered 2022-09-18: 1.5 mg via TRANSDERMAL
  Filled 2022-09-18: qty 1

## 2022-09-18 MED ORDER — SENNOSIDES-DOCUSATE SODIUM 8.6-50 MG PO TABS
2.0000 | ORAL_TABLET | ORAL | Status: DC
  Administered 2022-09-18 – 2022-09-19 (×2): 2 via ORAL
  Filled 2022-09-18 (×3): qty 2

## 2022-09-18 SURGICAL SUPPLY — 43 items
BARRIER ADHS 3X4 INTERCEED (GAUZE/BANDAGES/DRESSINGS) ×1 IMPLANT
BENZOIN TINCTURE PRP APPL 2/3 (GAUZE/BANDAGES/DRESSINGS) IMPLANT
CHLORAPREP W/TINT 26ML (MISCELLANEOUS) ×1 IMPLANT
CLAMP UMBILICAL CORD (MISCELLANEOUS) IMPLANT
CLOTH BEACON ORANGE TIMEOUT ST (SAFETY) ×1 IMPLANT
DRAPE C SECTION CLR SCREEN (DRAPES) ×1 IMPLANT
DRSG OPSITE POSTOP 4X10 (GAUZE/BANDAGES/DRESSINGS) ×1 IMPLANT
ELECT REM PT RETURN 9FT ADLT (ELECTROSURGICAL) ×1
ELECTRODE REM PT RTRN 9FT ADLT (ELECTROSURGICAL) ×1 IMPLANT
EXTRACTOR VACUUM M CUP 4 TUBE (SUCTIONS) IMPLANT
GAUZE SPONGE 4X4 12PLY STRL LF (GAUZE/BANDAGES/DRESSINGS) IMPLANT
GLOVE BIOGEL PI IND STRL 7.0 (GLOVE) ×2 IMPLANT
GLOVE ECLIPSE 6.5 STRL STRAW (GLOVE) ×1 IMPLANT
GOWN STRL REUS W/TWL LRG LVL3 (GOWN DISPOSABLE) ×2 IMPLANT
KIT ABG SYR 3ML LUER SLIP (SYRINGE) IMPLANT
NDL HYPO 25X5/8 SAFETYGLIDE (NEEDLE) IMPLANT
NEEDLE HYPO 22GX1.5 SAFETY (NEEDLE) ×1 IMPLANT
NEEDLE HYPO 25X5/8 SAFETYGLIDE (NEEDLE) IMPLANT
NS IRRIG 1000ML POUR BTL (IV SOLUTION) ×1 IMPLANT
PACK C SECTION WH (CUSTOM PROCEDURE TRAY) ×1 IMPLANT
PAD ABD 7.5X8 STRL (GAUZE/BANDAGES/DRESSINGS) IMPLANT
PAD OB MATERNITY 4.3X12.25 (PERSONAL CARE ITEMS) ×1 IMPLANT
RTRCTR C-SECT PINK 25CM LRG (MISCELLANEOUS) IMPLANT
STRIP CLOSURE SKIN 1/2X4 (GAUZE/BANDAGES/DRESSINGS) IMPLANT
SUT CHROMIC GUT AB #0 18 (SUTURE) IMPLANT
SUT MNCRL 0 VIOLET CTX 36 (SUTURE) ×3 IMPLANT
SUT MON AB 2-0 SH 27 (SUTURE)
SUT MON AB 2-0 SH27 (SUTURE) IMPLANT
SUT MON AB 3-0 SH 27 (SUTURE)
SUT MON AB 3-0 SH27 (SUTURE) IMPLANT
SUT MON AB 4-0 PS1 27 (SUTURE) IMPLANT
SUT MONOCRYL 0 CTX 36 (SUTURE) ×3
SUT PLAIN 2 0 (SUTURE)
SUT PLAIN 2 0 XLH (SUTURE) IMPLANT
SUT PLAIN ABS 2-0 CT1 27XMFL (SUTURE) IMPLANT
SUT VIC AB 0 CT1 36 (SUTURE) ×2 IMPLANT
SUT VIC AB 2-0 CT1 27 (SUTURE) ×1
SUT VIC AB 2-0 CT1 TAPERPNT 27 (SUTURE) ×1 IMPLANT
SUT VIC AB 4-0 PS2 27 (SUTURE) IMPLANT
SYR CONTROL 10ML LL (SYRINGE) ×1 IMPLANT
TOWEL OR 17X24 6PK STRL BLUE (TOWEL DISPOSABLE) ×1 IMPLANT
TRAY FOLEY W/BAG SLVR 14FR LF (SET/KITS/TRAYS/PACK) IMPLANT
WATER STERILE IRR 1000ML POUR (IV SOLUTION) ×1 IMPLANT

## 2022-09-18 NOTE — Progress Notes (Signed)
Called while in OR regarding FHR 70"s. Pt had SROM. Pitocin was not started after Epidural as pt was contracting.  On arrival pt was all fours  and FHR was back up. Pt was placed on back: ISE/IUPC was placed  BP 118/64. BP 74/21 VE 5/90/-2 ROP Tracing:  baseline 120  good variability some late/variable related to ? BP Ctx q 1-3 mins  IMP: Active phase Class A1 GDM Term P) assess  FHR and contraction pattern. IVF bolus( 1 L). Ephedrine for low BP.

## 2022-09-18 NOTE — Anesthesia Procedure Notes (Signed)
Epidural Patient location during procedure: OB Start time: 09/18/2022 3:35 AM End time: 09/18/2022 3:41 AM  Staffing Anesthesiologist: Pervis Hocking, DO Performed: anesthesiologist   Preanesthetic Checklist Completed: patient identified, IV checked, risks and benefits discussed, monitors and equipment checked, pre-op evaluation and timeout performed  Epidural Patient position: sitting Prep: DuraPrep and site prepped and draped Patient monitoring: continuous pulse ox, blood pressure, heart rate and cardiac monitor Approach: midline Location: L3-L4 Injection technique: LOR air  Needle:  Needle type: Tuohy  Needle gauge: 17 G Needle length: 9 cm Needle insertion depth: 7 cm Catheter type: closed end flexible Catheter size: 19 Gauge Catheter at skin depth: 12 cm Test dose: negative  Assessment Sensory level: T8 Events: blood not aspirated, injection not painful, no injection resistance, no paresthesia and negative IV test  Additional Notes Patient identified. Risks/Benefits/Options discussed with patient including but not limited to bleeding, infection, nerve damage, paralysis, failed block, incomplete pain control, headache, blood pressure changes, nausea, vomiting, reactions to medication both or allergic, itching and postpartum back pain. Confirmed with bedside nurse the patient's most recent platelet count. Confirmed with patient that they are not currently taking any anticoagulation, have any bleeding history or any family history of bleeding disorders. Patient expressed understanding and wished to proceed. All questions were answered. Sterile technique was used throughout the entire procedure. Please see nursing notes for vital signs. Test dose was given through epidural catheter and negative prior to continuing to dose epidural or start infusion. Warning signs of high block given to the patient including shortness of breath, tingling/numbness in hands, complete motor  block, or any concerning symptoms with instructions to call for help. Patient was given instructions on fall risk and not to get out of bed. All questions and concerns addressed with instructions to call with any issues or inadequate analgesia.  Reason for block:procedure for pain

## 2022-09-18 NOTE — Brief Op Note (Signed)
09/18/2022  7:25 AM  PATIENT:  Shalaine Payson  36 y.o. female  PRE-OPERATIVE DIAGNOSIS:  fetal intolerance to labor. Class A1 GDM, obesity affecting pregnancy, term gestation  POST-OPERATIVE DIAGNOSIS:  same  PROCEDURE:  Primary cesarean section, kerr hysterotomy  SURGEON:  Surgeon(s) and Role:    * Servando Salina, MD - Primary  PHYSICIAN ASSISTANT:   ASSISTANTS: none   ANESTHESIA:   epidural FINDINGS; live female, nl tubes and ovaries EBL:  532 mL   BLOOD ADMINISTERED:none  DRAINS: none   LOCAL MEDICATIONS USED:  MARCAINE     SPECIMEN:  No Specimen  DISPOSITION OF SPECIMEN:  N/A  COUNTS:  YES  TOURNIQUET:  * No tourniquets in log *  DICTATION: .Other Dictation: Dictation Number [id number]  PLAN OF CARE: Admit to inpatient   PATIENT DISPOSITION:  PACU - hemodynamically stable.   Delay start of Pharmacological VTE agent (>24hrs) due to surgical blood loss or risk of bleeding: no

## 2022-09-18 NOTE — Lactation Note (Signed)
This note was copied from a baby's chart. Lactation Consultation Note  Patient Name: Bailey Schwartz [id number] Date: 09/18/2022 Reason for consult: Initial assessment;Term;Maternal endocrine disorder Age:35 hours  P2 Has Breastfeeding Experience  LC entered the room and the infant was latched to the left breast.  Tongue was down, lips were flanged, some swallows were noted, and sucking was rhythmic.  The infant began to fall asleep at the breast.  The infant fed for 10 min while the LC was in the room, but was feeding before Bailey Schwartz entered.  The birth parent did not remember how long the feeding lasted.  Bailey Schwartz spoke with the birth parent and she stated that she tried to latch her oldest child, but was unable to breastfeed because he would not latch.  She stated that she thinks he may have had a tip tie.  The birth parent pumped for 2 months, but stated that she only made 3oz per pumping feeding and her son was taking in [name].  The birth parent stated that she would like to pump in addition to latching.  LC set up the DEBP and showed the birth parent how to use the pump, how to wash pump parts, and discussed milk storage guidelines.  The birth parent is aware that she should pump at every other feeding for added stimulation.  LC showed the birth parent how to hand express on the left breast and no drops were noted.  The birth parent pumped on the right breast using a size #24 flange.  No drops were noted during the 5 min that the [facility] observed the birth parent pumping.  LC spoke with the birth parent about outpatient lactation services.  [name] left her name on the board and exited the room.   Current Feeding Plan: Breastfeed 8+ times in 24 hours according to the infant's feeding cues.  Pump after every other feeding and feed EBM to the infant via a bottle or spoon.  Call [name] for assistance with breastfeeding.   Maternal Data Has patient been taught Hand Expression?: Yes Does the patient have  breastfeeding experience prior to this delivery?: Yes How long did the patient breastfeed?: Attempted to breastfeed, but oldest would not latch. Exclusively pumped for 2 months.  Feeding Mother's Current Feeding Choice: Breast Milk  LATCH Score Latch: Grasps breast easily, tongue down, lips flanged, rhythmical sucking.  Audible Swallowing: A few with stimulation  Type of Nipple: Everted at rest and after stimulation  Comfort (Breast/Nipple): Soft / non-tender  Hold (Positioning): No assistance needed to correctly position infant at breast.  LATCH Score: 9   Lactation Tools Discussed/Used Tools: Pump Breast pump type: Double-Electric Breast Pump Pump Education: Setup, frequency, and cleaning;Milk Storage Reason for Pumping: Parent's request; low supply with first child. Pumping frequency: After every other feeding  Interventions Interventions: Breast feeding basics reviewed;Education;DEBP;LC Services brochure;Breast massage;Hand express  Discharge Pump: Personal;Hands Free  Consult Status Consult Status: Follow-up Date: 09/19/22 Follow-up type: In-patient    Bailey Schwartz 09/18/2022, 11:15 AM

## 2022-09-18 NOTE — Anesthesia Postprocedure Evaluation (Signed)
Anesthesia Post Note  Patient: Bailey Schwartz  Procedure(s) Performed: [place]     Patient location during evaluation: PACU Anesthesia Type: Epidural Level of consciousness: awake Pain management: pain level controlled Vital Signs Assessment: post-procedure vital signs reviewed and stable Respiratory status: spontaneous breathing Cardiovascular status: stable Postop Assessment: no headache, no backache, epidural receding, patient able to bend at knees and no apparent nausea or vomiting Anesthetic complications: no   No notable events documented.  Last Vitals:  Vitals:   09/18/22 0745 09/18/22 0755  BP: (!) 108/58   Pulse: 91   Resp: 17   Temp:  36.6 C  SpO2: 97%     Last Pain:  Vitals:   09/18/22 0745  TempSrc:   PainSc: 2    Pain Goal:                   Huston Foley

## 2022-09-18 NOTE — Op Note (Unsigned)
Bailey Schwartz, Bailey Schwartz MEDICAL RECORD NO: [id number] ACCOUNT NO: [id number] DATE OF BIRTH: 05-10-1986 FACILITY: [facility] LOCATION: [facility] PHYSICIAN: Susano Cleckler A. Garwin Brothers, MD  Operative Report   DATE OF PROCEDURE: 09/18/2022  PREOPERATIVE DIAGNOSES:  Nonreassuring fetal tracing, class A1 gestational diabetes, term gestation.  PROCEDURE:  Emergency primary cesarean section, Kerr hysterotomy.  POSTOPERATIVE DIAGNOSES:  Nonreassuring fetal tracing, class A1 gestational diabetes, term gestation.  ANESTHESIA:  Epidural.  SURGEON:  Mouhamed Glassco A. Garwin Brothers, MD  ASSISTANT:  None.  DESCRIPTION OF PROCEDURE:  Under adequate epidural anesthesia, the patient was placed in the supine position with left lateral tilt.  She was quickly prepped for an emergency cesarean section.  0.25% Marcaine was injected along the planned Pfannenstiel  skin incision site.  Pfannenstiel skin incision was then made, carried down to the rectus fascia.  Rectus fascia opened transversely.  Rectus fascia was then bluntly and sharply dissected off the rectus muscle in a superior inferior fashion.  The rectus  muscle was split in the midline.  The parietal peritoneum was entered bluntly and extended.  A self-retaining Alexis retractor was then placed.  Vesicouterine peritoneum was opened quickly in a transverse fashion.  Bladder displaced inferiorly with blunt  dissection.  Low transverse uterine incision was then made and extended bluntly in cephalic and caudad manner.  A loop of cord quickly jumped out of the incision as was fluid.  Subsequent delivery of a live female from the occiput posterior position was  accomplished.  Delayed cord clamp x1 minute was done.  Subsequently, the baby was transferred to the waiting pediatricians who assigned Apgars of 9 and 9 at 1 and 5 minutes respectively.  Placenta was spontaneous intact, not sent to pathology.  Uterine  cavity was cleaned of debris.  Uterine incision had no extension.  It was closed  in 2 layers, the first layer of 0 Monocryl running locked stitch, second layer was imbricating using 0 Monocryl suture.  Bleeding on the left angle was noted where a  modified O'Leary stitch was placed to secure that vessel.  Normal tubes and ovaries were noted bilaterally.  Abdomen was irrigated and suctioned of debris.  Interceed was placed to overlying in inverted T fashion.  Once hemostasis was achieved, the  Alexis retractor was removed.  Parietal peritoneum was closed with 2-0 Vicryl.  The rectus fascia was closed with 0 Vicryl x2.  The subcutaneous area was irrigated, small bleeders cauterized.  Interrupted 2-0 plain sutures placed and the skin  approximated using 4-0 Vicryl subcuticular closure.  Steri-Strips and benzoin was placed.  SPECIMEN:  Placenta, not sent to pathology.  ESTIMATED BLOOD LOSS:  480 mL  INTRAOPERATIVE FLUIDS:  1400 mL  URINE OUTPUT:  100 mL  COUNTS:  Sponge and instrument counts x2 was correct.  COMPLICATIONS:  None.  The patient tolerated the procedure well and was transferred to recovery in stable condition.   VAI D: 09/18/2022 10:32:02 pm T: 09/18/2022 10:39:00 pm  JOB: [id number]/ [id number]

## 2022-09-18 NOTE — Progress Notes (Signed)
S: c/o back pain and pelvic pain S/p cytotec @ 11:40  O: BP 110/61   Pulse 97   Temp 98.3 F (36.8 C) (Oral)   Resp 20   Ht 5\' 3"  (1.6 m)   Wt 123 kg   LMP 09/28/2021   SpO2 98%   BMI 48.04 kg/m  VE 4/50/-3  Tracing cat 1 CBG (last 3)  Recent Labs    09/17/22 1932 09/17/22 2336 09/18/22 0313  GLUCAP 91 74 79   Latent phase Class A1 GDM Term P) epidural. Pitocin augmentation

## 2022-09-18 NOTE — Anesthesia Preprocedure Evaluation (Signed)
Anesthesia Evaluation  Patient identified by MRN, date of birth, ID band Patient awake    Reviewed: Allergy & Precautions, Patient's Chart, lab work & pertinent test results, reviewed documented beta blocker date and time   Airway Mallampati: III  TM Distance: >3 FB Neck ROM: Full    Dental no notable dental hx.    Pulmonary neg pulmonary ROS, former smoker,    Pulmonary exam normal breath sounds clear to auscultation       Cardiovascular hypertension, Pt. on medications and Pt. on home beta blockers Normal cardiovascular exam Rhythm:Regular Rate:Normal     Neuro/Psych  Headaches, PSYCHIATRIC DISORDERS Anxiety Depression    GI/Hepatic negative GI ROS, Neg liver ROS,   Endo/Other  diabetes, Well Controlled, GestationalMorbid obesityBMI 48  Renal/GU negative Renal ROS  negative genitourinary   Musculoskeletal negative musculoskeletal ROS (+)   Abdominal (+) + obese,   Peds negative pediatric ROS (+)  Hematology  (+) Blood dyscrasia, anemia , Hb 10.8, plt 300   Anesthesia Other Findings   Reproductive/Obstetrics (+) Pregnancy                             Anesthesia Physical Anesthesia Plan  ASA: 3  Anesthesia Plan: Epidural   Post-op Pain Management:    Induction:   PONV Risk Score and Plan: 2  Airway Management Planned: Natural Airway  Additional Equipment: None  Intra-op Plan:   Post-operative Plan:   Informed Consent: I have reviewed the patients History and Physical, chart, labs and discussed the procedure including the risks, benefits and alternatives for the proposed anesthesia with the patient or authorized representative who has indicated his/her understanding and acceptance.       Plan Discussed with:   Anesthesia Plan Comments:         Anesthesia Quick Evaluation

## 2022-09-18 NOTE — Transfer of Care (Signed)
Immediate Anesthesia Transfer of Care Note  Patient: Bailey Schwartz  Procedure(s) Performed: CESAREAN SECTION  Patient Location: PACU  Anesthesia Type:Epidural  Level of Consciousness: awake  Airway & Oxygen Therapy: Patient Spontanous Breathing  Post-op Assessment: Report given to RN  Post vital signs: Reviewed and stable  Last Vitals:  Vitals Value Taken Time  BP 100/56 09/18/22 0705  Temp 36.4 C 09/18/22 0700  Pulse 91 09/18/22 0711  Resp 17 09/18/22 0711  SpO2 99 % 09/18/22 0711  Vitals shown include unvalidated device data.  Last Pain:  Vitals:   09/18/22 0700  TempSrc: Oral  PainSc:          Complications: No notable events documented.

## 2022-09-19 ENCOUNTER — Encounter (HOSPITAL_COMMUNITY): Payer: Self-pay | Admitting: Obstetrics and Gynecology

## 2022-09-19 LAB — CBC
HCT: 31.7 % — ABNORMAL LOW (ref 36.0–46.0)
Hemoglobin: 9.9 g/dL — ABNORMAL LOW (ref 12.0–15.0)
MCH: 26.4 pg (ref 26.0–34.0)
MCHC: 31.2 g/dL (ref 30.0–36.0)
MCV: 84.5 fL (ref 80.0–100.0)
Platelets: 246 10*3/uL (ref 150–400)
RBC: 3.75 MIL/uL — ABNORMAL LOW (ref 3.87–5.11)
RDW: 14.6 % (ref 11.5–15.5)
WBC: 12.5 10*3/uL — ABNORMAL HIGH (ref 4.0–10.5)
nRBC: 0 % (ref 0.0–0.2)

## 2022-09-19 NOTE — Social Work (Signed)
CSW received consult for hx of Anxiety and Depression.  CSW met with MOB to offer support and complete assessment. When CSW entered the room, MOB and FOB were up tending to the infants needs. CSW offer privacy, MOB gave CSW permission to speak while FOB was present. CSW introduced self, CSW role and reason for visit, MOB was agreeable to visit. CSW Inquired about how MOB was feeling, MOB reported she is doing okay, MOB reported her delivery didn't go as planned but they are both here and health and that is what matters. CSW agreed.  CSW inquired about MOB's MH hx MOB reported she was diagnosed with anxiety and Depression as a teen. CSW inquired about treatment, MOB reported she and her PCP were working on finding the right medication when she found out she was pregnant so they decided to wait until after delivery. MOB reported she was also working with a psychiatrist prior to pregnancy. MOB reported she was seeing a therapist when she was living in [place] but has not found one since moving to [place]. CSW inquired about MOB's mood in pregnancy, MOB reported a stable mood, MOB reported she kept busy with her other child and spent time outdoors to cope with MH symptoms. CSW assessed for safety, MOB denied any SI or HI. MOB identified her spouse and parents as her supports. CSW provided education regarding the baby blues period vs. perinatal mood disorders, discussed treatment and gave resources for mental health follow up if concerns arise.  CSW recommends self-evaluation during the postpartum time period using the New Mom Checklist from Postpartum Progress and encouraged MOB to contact a medical professional if symptoms are noted at any time.    CSW provided review of Sudden Infant Death Syndrome (SIDS) precautions.  MOB identified [facility] in [place] for infants follow up care. MOB reported they have all necessary items for the infant including a bassinet for him to sleep. CSW identifies no further  need for intervention and no barriers to discharge at this time.  Letta Kocher, [place] Social Worker [phone]

## 2022-09-19 NOTE — Progress Notes (Signed)
SVD: {findings; c - section delivery reason:31449}  S:  Pt reports feeling ***/ Tolerating po/ Voiding without problems/ No n/v/ Bleeding is {Description; bleeding vaginal:11356}/ Pain controlled with{treatments; pain control med:13496}    O:  A & O x 3 / VS: Blood pressure 111/65, pulse 99, temperature 98 F (36.7 C), resp. rate 18, height 5\' 3"  (1.6 m), weight 123 kg, last menstrual period 09/28/2021, SpO2 98 %, unknown if currently breastfeeding.  LABS:  Results for orders placed or performed during the hospital encounter of 09/17/22 (from the past 24 hour(s))  CBC     Status: Abnormal   Collection Time: 09/19/22  4:18 AM  Result Value Ref Range   WBC 12.5 (H) 4.0 - 10.5 K/uL   RBC 3.75 (L) 3.87 - 5.11 MIL/uL   Hemoglobin 9.9 (L) 12.0 - 15.0 g/dL   HCT 31.7 (L) 36.0 - 46.0 %   MCV 84.5 80.0 - 100.0 fL   MCH 26.4 26.0 - 34.0 pg   MCHC 31.2 30.0 - 36.0 g/dL   RDW 14.6 11.5 - 15.5 %   Platelets 246 150 - 400 K/uL   nRBC 0.0 0.0 - 0.2 %    I&O: I/O last 3 completed shifts: In: 1800 [I.V.:1750; IV Piggyback:50] Out: 1882 [Urine:1350; Blood:532]   No intake/output data recorded.  Lungs: {Exam; lungs:5033}  Heart: {Exam; heart:5510}  Abdomen: {PE ABDOMEN POSTPARTUM OBGYN:313106}  Perineum: {exam; perineum:10172}  Lochia: ***  Extremities:{pe extremities CH:885027}    A/P: POD # 1/PPD # 1/ G2P2002  Doing well  Continue routine post partum orders  ***

## 2022-09-20 MED ORDER — IBUPROFEN 600 MG PO TABS: 600.0000 mg | ORAL_TABLET | Freq: Four times a day (QID) | ORAL | 11 refills | Status: DC | PRN

## 2022-09-20 MED ORDER — OXYCODONE HCL 5 MG PO TABS: 5.0000 mg | ORAL_TABLET | ORAL | 0 refills | Status: AC | PRN

## 2022-09-20 NOTE — Discharge Summary (Signed)
Postpartum Discharge Summary  Date of Service updated***     Patient Name: Bailey Schwartz DOB: 04-Nov-1986 MRN: [id number]  Date of admission: 09/17/2022 Delivery date:09/18/2022  Delivering provider: Yarithza Mink  Date of discharge: 09/20/2022  Admitting diagnosis: White classification A1 gestational diabetes mellitus (GDM) [O24.410] Postpartum care following cesarean delivery [Z39.2] Intrauterine pregnancy: [redacted]w[redacted]d    Secondary diagnosis:  Principal Problem:   White classification A1 gestational diabetes mellitus (GDM) Active Problems:   Postpartum care following cesarean delivery  Additional problems: ***    Discharge diagnosis: {DX.:23714}                                              Post partum procedures:{Postpartum procedures:23558} Augmentation: {{NWGNFAOZHYQM:57846}Complications: {OB Labor/Delivery Complications:20784}  Hospital course: {Courses:23701}  Magnesium Sulfate received: {Mag received:30440022} BMZ received: {BMZ received:30440023} Rhophylac:{Rhophylac received:30440032} MMR:{MMR:30440033} T-DaP:{Tdap:23962} Flu: {{NGE:95284}Transfusion:{Transfusion received:30440034}  Physical exam  Vitals:   09/19/22 0524 09/19/22 1340 09/19/22 2100 09/20/22 0545  BP: 111/65 106/76 122/73 108/63  Pulse: 99 92 83 85  Resp: '18 18 18 18  ' Temp: 98 F (36.7 C) 97.9 F (36.6 C) 98.4 F (36.9 C) 98.3 F (36.8 C)  TempSrc:  Oral Oral Oral  SpO2: 98% 98% 99% 100%  Weight:      Height:       General: {Exam; general:21111117} Lochia: {Desc; appropriate/inappropriate:30686::"appropriate"} Uterine Fundus: {Desc; firm/soft:30687} Incision: {Exam; incision:21111123} DVT Evaluation: {Exam; dvt:2111122} Labs: Lab Results  Component Value Date   WBC 12.5 (H) 09/19/2022   HGB 9.9 (L) 09/19/2022   HCT 31.7 (L) 09/19/2022   MCV 84.5 09/19/2022   PLT 246 09/19/2022      Latest Ref Rng & Units 11/10/2020   12:00 AM  CMP  Glucose 65 - 99 mg/dL 108   BUN 7  - 25 mg/dL 11   Creatinine 0.50 - 1.10 mg/dL 0.74   Sodium 135 - 146 mmol/L 137   Potassium 3.5 - 5.3 mmol/L 3.5   Chloride 98 - 110 mmol/L 103   CO2 20 - 32 mmol/L 23   Calcium 8.6 - 10.2 mg/dL 9.3   Total Protein 6.1 - 8.1 g/dL 7.2   Total Bilirubin 0.2 - 1.2 mg/dL 0.3   AST 10 - 30 U/L 51   ALT 6 - 29 U/L 123    Edinburgh Score:    09/19/2022    8:30 AM  Edinburgh Postnatal Depression Scale Screening Tool  I have been able to laugh and see the funny side of things. 0  I have looked forward with enjoyment to things. 0  I have blamed myself unnecessarily when things went wrong. 1  I have been anxious or worried for no good reason. 1  I have felt scared or panicky for no good reason. 0  Things have been getting on top of me. 0  I have been so unhappy that I have had difficulty sleeping. 0  I have felt sad or miserable. 0  I have been so unhappy that I have been crying. 0  The thought of harming myself has occurred to me. 0  Edinburgh Postnatal Depression Scale Total 2      After visit meds:  Allergies as of 09/20/2022       Reactions   Sulfamethoxazole-trimethoprim Hives, Swelling, Rash   Angioedema   Fluconazole Hives, Swelling, Rash     Med  Rec must be completed prior to using this [facility]***        Discharge home in stable condition Infant Feeding: {Baby feeding:23562} Infant Disposition:{CHL IP OB HOME WITH RYRYGB:33882} Discharge instruction: per After Visit Summary and Postpartum booklet. Activity: Advance as tolerated. Pelvic rest for 6 weeks.  Diet: {OB diet:21111121} Anticipated Birth Control: {Birth Control:23956} Postpartum Appointment:{Outpatient follow up:23559} Additional Postpartum F/U: {PP Procedure:23957} Future Appointments:No future appointments. Follow up Visit:  Follow-up Information     Servando Salina, MD Follow up in 6 week(s).   Specialty: Obstetrics and Gynecology Contact information: [address] [place] [address] [phone]                     09/20/2022 Marvene Staff, MD

## 2022-09-20 NOTE — Lactation Note (Signed)
This note was copied from a baby's chart. Lactation Consultation Note  Patient Name: Bailey Schwartz Date: 09/20/2022 Reason for consult: Follow-up assessment;Term Age:35 hours   P2: Term infant at 39+1 weeks Feeding preference: Breast/formula Weight loss: 8%  Baby was dressed and asleep on birth parent's chest when I arrived.  Birth parent had no questions/concerns related to breast feeding.  She has supplemented with formula.  Appropriate volumes have been given; baby is voiding/stooling.  Encouraged to continue feeding on cue.  Family has our op phone number for any concerns after discharge.  Support person at bedside.  Family awaiting RN for discharge; called RN.   Maternal Data    Feeding    LATCH Score                    Lactation Tools Discussed/Used    Interventions Interventions: Education  Discharge Discharge Education: Engorgement and breast care Pump: Personal  Consult Status Consult Status: Complete Date: 09/20/22 Follow-up type: Call as needed    Bailey Schwartz 09/20/2022, 11:24 AM

## 2022-09-20 NOTE — Progress Notes (Signed)
SVD: primary  S:  Pt reports feeling well. Desires early d/c/ Tolerating po/ Voiding without problems/ No n/v/ Bleeding is light/ Pain controlled withprescription NSAID's including ibuprofen (Motrin) and narcotic analgesics including oxycodone (Oxycontin, Oxyir)    O:  A & O x 3 / VS: Blood pressure 108/63, pulse 85, temperature 98.3 F (36.8 C), temperature source Oral, resp. rate 18, height 5\' 3"  (1.6 m), weight 123 kg, last menstrual period 09/28/2021, SpO2 100 %, unknown if currently breastfeeding.  LABS: No results found for this or any previous visit (from the past 24 hour(s)).  I&O: I/O last 3 completed shifts: In: -  Out: 900 [Urine:900]   No intake/output data recorded.  Lungs: chest clear, no wheezing, rales, normal symmetric air entry  Heart: regular rate and rhythm, S1, S2 normal, no murmur, click, rub or gallop  Abdomen: sof obese uterus firm @ umb Dressing d/c/i  Perineum: not inspected  Lochia: just changed  Extremities:no redness or tenderness in the calves or thighs, no edema    A/P: POD # 2/PPD # 2/ [id number] Class A1 GDM  Doing well  Continue routine post partum orders  D/c instructions reviewed/  F/u 6 wk  8 wk pp for 2hr GTT

## 2022-09-20 NOTE — Plan of Care (Signed)
  Problem: Education: Goal: Knowledge of General Education information will improve Description: Including pain rating scale, medication(s)/side effects and non-pharmacologic comfort measures Outcome: Adequate for Discharge   Problem: Health Behavior/Discharge Planning: Goal: Ability to manage health-related needs will improve Outcome: Adequate for Discharge   Problem: Clinical Measurements: Goal: Ability to maintain clinical measurements within normal limits will improve Outcome: Adequate for Discharge Goal: Will remain free from infection Outcome: Adequate for Discharge Goal: Diagnostic test results will improve Outcome: Adequate for Discharge Goal: Respiratory complications will improve Outcome: Adequate for Discharge Goal: Cardiovascular complication will be avoided Outcome: Adequate for Discharge   Problem: Activity: Goal: Risk for activity intolerance will decrease Outcome: Adequate for Discharge   Problem: Nutrition: Goal: Adequate nutrition will be maintained Outcome: Adequate for Discharge   Problem: Coping: Goal: Level of anxiety will decrease Outcome: Adequate for Discharge   Problem: Elimination: Goal: Will not experience complications related to bowel motility Outcome: Adequate for Discharge Goal: Will not experience complications related to urinary retention Outcome: Adequate for Discharge   Problem: Pain Managment: Goal: General experience of comfort will improve Outcome: Adequate for Discharge   Problem: Safety: Goal: Ability to remain free from injury will improve Outcome: Adequate for Discharge   Problem: Skin Integrity: Goal: Risk for impaired skin integrity will decrease Outcome: Adequate for Discharge   Problem: Education: Goal: Knowledge of the prescribed therapeutic regimen will improve Outcome: Adequate for Discharge Goal: Understanding of sexual limitations or changes related to disease process or condition will improve Outcome: Adequate  for Discharge Goal: Individualized Educational Video(s) Outcome: Adequate for Discharge   Problem: Self-Concept: Goal: Communication of feelings regarding changes in body function or appearance will improve Outcome: Adequate for Discharge   Problem: Skin Integrity: Goal: Demonstration of wound healing without infection will improve Outcome: Adequate for Discharge   Problem: Education: Goal: Knowledge of condition will improve Outcome: Adequate for Discharge Goal: Individualized Educational Video(s) Outcome: Adequate for Discharge Goal: Individualized Newborn Educational Video(s) Outcome: Adequate for Discharge   Problem: Activity: Goal: Will verbalize the importance of balancing activity with adequate rest periods Outcome: Adequate for Discharge Goal: Ability to tolerate increased activity will improve Outcome: Adequate for Discharge   Problem: Coping: Goal: Ability to identify and utilize available resources and services will improve Outcome: Adequate for Discharge   Problem: Life Cycle: Goal: Chance of risk for complications during the postpartum period will decrease Outcome: Adequate for Discharge   Problem: Role Relationship: Goal: Ability to demonstrate positive interaction with newborn will improve Outcome: Adequate for Discharge   Problem: Skin Integrity: Goal: Demonstration of wound healing without infection will improve Outcome: Adequate for Discharge   

## 2022-09-20 NOTE — Discharge Instructions (Signed)
Call if temperature greater than equal to 100.4, nothing per vagina for 4-6 weeks or severe nausea vomiting, increased incisional pain , drainage or redness in the incision site, no straining with bowel movements, showers no bath °

## 2022-09-29 ENCOUNTER — Telehealth (HOSPITAL_COMMUNITY): Payer: Self-pay | Admitting: *Deleted

## 2022-09-29 NOTE — Telephone Encounter (Signed)
Left phone voicemail message.  Odis Hollingshead, RN 09-29-2022 at 4:14pm

## 2022-10-28 LAB — HM PAP SMEAR: HPV, high-risk: NEGATIVE

## 2022-11-03 ENCOUNTER — Encounter: Payer: Self-pay | Admitting: Family Medicine

## 2022-11-03 MED ORDER — RIZATRIPTAN BENZOATE 5 MG PO TABS: ORAL_TABLET | ORAL | 0 refills | Status: DC

## 2022-11-03 NOTE — Telephone Encounter (Signed)
Pended RX for Rizatriptan.  Last seen in January 2023 It looks like PCP got changed to OBGYN office, but doesn't appear patient is aware of why that happened.  Please sign if appropriate.

## 2022-12-21 ENCOUNTER — Encounter: Payer: Self-pay | Admitting: Family Medicine

## 2022-12-21 ENCOUNTER — Ambulatory Visit (INDEPENDENT_AMBULATORY_CARE_PROVIDER_SITE_OTHER): Payer: BC Managed Care – PPO | Admitting: Family Medicine

## 2022-12-21 VITALS — BP 138/89 | HR 89 | Temp 97.4°F | Ht 63.0 in | Wt 252.2 lb

## 2022-12-21 DIAGNOSIS — G43001 Migraine without aura, not intractable, with status migrainosus: Secondary | ICD-10-CM

## 2022-12-21 DIAGNOSIS — Z13220 Encounter for screening for lipoid disorders: Secondary | ICD-10-CM | POA: Insufficient documentation

## 2022-12-21 DIAGNOSIS — Z1211 Encounter for screening for malignant neoplasm of colon: Secondary | ICD-10-CM | POA: Diagnosis not present

## 2022-12-21 DIAGNOSIS — Z0000 Encounter for general adult medical examination without abnormal findings: Secondary | ICD-10-CM | POA: Insufficient documentation

## 2022-12-21 DIAGNOSIS — Z7689 Persons encountering health services in other specified circumstances: Secondary | ICD-10-CM

## 2022-12-21 HISTORY — DX: Encounter for screening for lipoid disorders: Z13.220

## 2022-12-21 MED ORDER — METHYLPREDNISOLONE 4 MG PO TBPK: ORAL_TABLET | ORAL | 0 refills | Status: DC

## 2022-12-21 MED ORDER — RIZATRIPTAN BENZOATE 5 MG PO TABS: ORAL_TABLET | ORAL | 0 refills | Status: DC

## 2022-12-21 NOTE — Progress Notes (Signed)
New Patient Office Visit  Subjective    Patient ID: Bailey Schwartz, female    DOB: 1986/03/14  Age: 37 y.o. MRN: [id number]  CC:  Chief Complaint  Patient presents with   Migraine    Have progressed since giving birth.    Referral    Colonoscopy; Family Hx of Polyps    HPI Bailey Schwartz presents to establish care with this provider. She is new to me. Her PCP left practice and she was never reassigned a new PCP. She wants to discuss migraines.  History of migraine, one sided, behind eyes. Denies aura. Endorses light sensitivity and noise sensitivity. Has had headache for past 4 days.  Taking rizatriptan 5 mg at onset and repeated in 2 hours without improvement. Propanolol has not worked in past.  MRI 10/01/21: No acute findings.  Had baby in October, weaning breast feeding since January, headaches seem to be getting worse now. Triggers include rain as well.   Would like to start maintenance medication.      Outpatient Encounter Medications as of 12/21/2022  Medication Sig   INCASSIA 0.35 MG tablet Take 1 tablet by mouth daily.   JUNEL FE 1/20 1-20 MG-MCG tablet Take by mouth.   methylPREDNISolone (MEDROL DOSEPAK) 4 MG TBPK tablet 6-day pack as directed   Prenatal Vit-Fe Fumarate-FA (PRENATAL PO) Take 2 tablets by mouth daily.   [DISCONTINUED] rizatriptan (MAXALT) 5 MG tablet TAKE 1 TABLET BY MOUTH AS NEEDED FOR MIGRAINE. MAY REPEAT IN 2 HOURS IF NEEDED   rizatriptan (MAXALT) 5 MG tablet TAKE 1 TABLET BY MOUTH AS NEEDED FOR MIGRAINE. MAY REPEAT IN 2 HOURS IF NEEDED   [DISCONTINUED] Butalbital-APAP-Caffeine 50-300-40 MG CAPS Take 1 capsule by mouth every 6 (six) hours as needed. (Patient not taking: Reported on 12/21/2022)   [DISCONTINUED] ferrous sulfate 325 (65 FE) MG tablet Take 325 mg by mouth daily. (Patient not taking: Reported on 12/21/2022)   [DISCONTINUED] ibuprofen (ADVIL) 600 MG tablet Take 1 tablet (600 mg total) by mouth every 6 (six) hours as needed. (Patient not taking:  Reported on 12/21/2022)   No facility-administered encounter medications on file as of 12/21/2022.    Past Medical History:  Diagnosis Date   Depression    Genital warts    Gestational diabetes    History of abnormal cervical Pap smear    Hx of HPV   History of chicken pox    Migraine    Preeclampsia    Vaginal Pap smear, abnormal     Past Surgical History:  Procedure Laterality Date   CESAREAN SECTION N/A 09/18/2022   Procedure: CESAREAN SECTION;  Surgeon: Servando Salina, MD;  Location: [facility];  Service: Obstetrics;  Laterality: N/A;   REFRACTIVE SURGERY      Family History  Problem Relation Age of Onset   59 / Stillbirths Mother    Hypertension Mother    Diabetes Mother    Esophageal cancer Maternal Grandmother    Hyperlipidemia Maternal Grandmother    Hypertension Maternal Grandmother    Liver cancer Maternal Grandfather    Aneurysm Maternal Grandfather    Hyperlipidemia Paternal Grandmother    Hypertension Paternal Grandmother    Hyperlipidemia Paternal Grandfather    Hypertension Paternal Grandfather    Stroke Paternal Grandfather     Social History   Socioeconomic History   Marital status: Married    Spouse name: Not on file   Number of children: 1   Years of education: Not on file   Highest education  level: Not on file  Occupational History    Employer: [employer]  Tobacco Use   Smoking status: Former    Packs/day: 0.18    Types: Cigarettes   Smokeless tobacco: Never  Vaping Use   Vaping Use: Never used  Substance and Sexual Activity   Alcohol use: Not Currently   Drug use: Not Currently   Sexual activity: Not on file  Other Topics Concern   Not on file  Social History Narrative   Not on file   Social Determinants of Health   Financial Resource Strain: Not on file  Food Insecurity: No Food Insecurity (09/17/2022)   Hunger Vital Sign    Worried About Running Out of Food in the Last Year: Never true     Ran Out of Food in the Last Year: Never true  Transportation Needs: No Transportation Needs (09/17/2022)   PRAPARE - Hydrologist (Medical): No    Lack of Transportation (Non-Medical): No  Physical Activity: Not on file  Stress: Not on file  Social Connections: Not on file  Intimate Partner Violence: Not At Risk (09/17/2022)   Humiliation, Afraid, Rape, and Kick questionnaire    Fear of Current or Ex-Partner: No    Emotionally Abused: No    Physically Abused: No    Sexually Abused: No    Review of Systems  Eyes:  Positive for photophobia (sensitivity with headache).  Respiratory:  Negative for shortness of breath.   Cardiovascular:  Negative for chest pain.  Gastrointestinal:  Negative for nausea and vomiting.  Neurological:  Positive for headaches. Negative for dizziness and weakness.        Objective    BP 138/89   Pulse 89   Temp (!) 97.4 F (36.3 C) (Temporal)   Ht 5\' 3"  (1.6 m)   Wt 252 lb 3.2 oz (114.4 kg)   SpO2 97%   BMI 44.68 kg/m   Physical Exam Vitals and nursing note reviewed.  Constitutional:      General: She is not in acute distress.    Appearance: Normal appearance.  Cardiovascular:     Rate and Rhythm: Normal rate and regular rhythm.  Pulmonary:     Effort: Pulmonary effort is normal.     Breath sounds: Normal breath sounds.  Skin:    General: Skin is warm and dry.     Capillary Refill: Capillary refill takes less than 2 seconds.  Neurological:     General: No focal deficit present.     Mental Status: She is alert and oriented to person, place, and time. Mental status is at baseline.     GCS: GCS eye subscore is 4. GCS verbal subscore is 5. GCS motor subscore is 6.     Cranial Nerves: No cranial nerve deficit or facial asymmetry.     Sensory: Sensation is intact.     Motor: No weakness.     Coordination: Romberg sign negative. Coordination normal. Finger-Nose-Finger Test and Heel to [facility] Test normal.     Gait:  Gait is intact. Gait normal.  Psychiatric:        Mood and Affect: Mood normal.        Behavior: Behavior normal.        Thought Content: Thought content normal.        Judgment: Judgment normal.         Assessment & Plan:   Problem List Items Addressed This Visit     Migraine  without aura and with status migrainosus, not intractable    History of migraine without aura taking Relpax at start of headache and repeating in 2 hours without improvement in symptoms.  Reports that she has had this headache for the last 4 days.  Endorses light sensitivity and noise sensitivity denies nausea vomiting. She is sitting in exam room with eyes open.  She had a baby in October, she is weaning from breast-feeding, and feels that the migraines have increased since weaning.  Has tried propranolol maintenance in the past without improvement in symptoms.  Discussed starting topiramate today, however, this may interfere with the effectiveness of her birth control, patient declines.  Reports in the past her PCP has given her steroid Dosepak with improvement.  Her neurological exam was normal in the office today.  Vital signs are stable.  Medrol Dosepak prescribed, ambulatory referral for neurology for further evaluation placed today. Refill sent for Maxalt today.       Relevant Medications   rizatriptan (MAXALT) 5 MG tablet   methylPREDNISolone (MEDROL DOSEPAK) 4 MG TBPK tablet   Other Relevant Orders   Ambulatory referral to Neurology   Establishing care with new doctor, encounter for - Primary  Agrees with plan of care discussed.  Questions answered.   Return in about 2 weeks (around 01/04/2023) for cpe and migraine .   Novella Olive, FNP

## 2022-12-21 NOTE — Assessment & Plan Note (Addendum)
History of migraine without aura taking Relpax at start of headache and repeating in 2 hours without improvement in symptoms.  Reports that she has had this headache for the last 4 days.  Endorses light sensitivity and noise sensitivity denies nausea vomiting. She is sitting in exam room with eyes open.  She had a baby in October, she is weaning from breast-feeding, and feels that the migraines have increased since weaning.  Has tried propranolol maintenance in the past without improvement in symptoms.  Discussed starting topiramate today, however, this may interfere with the effectiveness of her birth control, patient declines.  Reports in the past her PCP has given her steroid Dosepak with improvement.  Her neurological exam was normal in the office today.  Vital signs are stable.  Medrol Dosepak prescribed, ambulatory referral for neurology for further evaluation placed today. Refill sent for Maxalt today.

## 2022-12-28 ENCOUNTER — Encounter: Payer: Self-pay | Admitting: Gastroenterology

## 2023-01-11 ENCOUNTER — Ambulatory Visit (INDEPENDENT_AMBULATORY_CARE_PROVIDER_SITE_OTHER): Payer: BC Managed Care – PPO | Admitting: Family Medicine

## 2023-01-11 ENCOUNTER — Encounter: Payer: Self-pay | Admitting: Family Medicine

## 2023-01-11 VITALS — BP 114/79 | HR 84 | Temp 98.4°F | Ht 63.0 in | Wt 259.1 lb

## 2023-01-11 DIAGNOSIS — Z13220 Encounter for screening for lipoid disorders: Secondary | ICD-10-CM

## 2023-01-11 DIAGNOSIS — Z0000 Encounter for general adult medical examination without abnormal findings: Secondary | ICD-10-CM | POA: Diagnosis not present

## 2023-01-11 DIAGNOSIS — G43709 Chronic migraine without aura, not intractable, without status migrainosus: Secondary | ICD-10-CM | POA: Diagnosis not present

## 2023-01-11 DIAGNOSIS — F320 Major depressive disorder, single episode, mild: Secondary | ICD-10-CM

## 2023-01-11 HISTORY — DX: Major depressive disorder, single episode, mild: F32.0

## 2023-01-11 NOTE — Progress Notes (Addendum)
Complete physical exam  Patient: Bailey Schwartz   DOB: [birth year]   38 y.o. Female  MRN: [id number]  Subjective:    Chief Complaint  Patient presents with   Annual Exam   Migraine    Bailey Schwartz is a 37 y.o. female who presents today for a complete physical exam. She reports consuming a general diet.  Trying to establish routine at home on treadmill.   She generally feels well. She reports sleeping fairly well. She does have additional problems to discuss today.   Had one migraine since last visit, took 3 days for it to resolve. Has neurology appointment on 02/14/23 at 2:00. Unable to go on propanolol due to birth control, she will await neurology input.    Discussed depression screening: Has a name of therapist to call for counseling.  Denies suicidal ideation. "I love my baby".   Wants to get fasting labs today.   Most recent fall risk assessment:    12/21/2022   10:14 AM  Fall Risk   Falls in the past year? 0  Number falls in past yr: 0  Injury with Fall? 0  Risk for fall due to : No Fall Risks  Follow up Falls evaluation completed     Most recent depression screenings:    12/21/2022   10:13 AM 12/27/2021    2:30 PM  PHQ 2/9 Scores  PHQ - 2 Score 1   PHQ- 9 Score 5      Information is confidential and restricted. Go to Review Flowsheets to unlock data.    Vision:Not within last year  and Dental: No current dental problems and Receives regular dental care  Past Medical History:  Diagnosis Date   Depression    Genital warts    Gestational diabetes    History of abnormal cervical Pap smear    Hx of HPV   History of chicken pox    Migraine    Preeclampsia    Vaginal Pap smear, abnormal       Patient Care Team: Chalmers Guest, FNP as PCP - General (Family Medicine)   Outpatient Medications Prior to Visit  Medication Sig Note   JUNEL FE 1/20 1-20 MG-MCG tablet Take by mouth. 01/11/2023: Started.   Prenatal Vit-Fe Fumarate-FA (PRENATAL PO) Take 2 tablets  by mouth daily.    rizatriptan (MAXALT) 5 MG tablet TAKE 1 TABLET BY MOUTH AS NEEDED FOR MIGRAINE. MAY REPEAT IN 2 HOURS IF NEEDED    INCASSIA 0.35 MG tablet Take 1 tablet by mouth daily. (Patient not taking: Reported on 01/11/2023)    methylPREDNISolone (MEDROL DOSEPAK) 4 MG TBPK tablet 6-day pack as directed (Patient not taking: Reported on 01/11/2023)    No facility-administered medications prior to visit.    Review of Systems  Constitutional:  Negative for chills, fever, malaise/fatigue (has toddler.) and weight loss.  Respiratory:  Negative for shortness of breath.   Cardiovascular:  Negative for chest pain.  Gastrointestinal:  Negative for abdominal pain, nausea and vomiting.  Musculoskeletal:  Positive for back pain (low back since had baby.). Negative for falls.  Neurological:  Positive for headaches. Negative for dizziness and weakness.  Psychiatric/Behavioral:  Positive for depression. Negative for suicidal ideas. The patient is nervous/anxious.        Able to function, has days she feels down.           Objective:     BP 114/79   Pulse 84   Temp 98.4 F (36.9 C) (Oral)  Ht 5' 3"$  (1.6 m)   Wt 259 lb 1.6 oz (117.5 kg)   LMP  (LMP Unknown)   SpO2 97%   Breastfeeding No   BMI 45.90 kg/m  BP Readings from Last 3 Encounters:  01/11/23 114/79  12/21/22 138/89  09/20/22 108/63      Physical Exam Vitals and nursing note reviewed.  Constitutional:      General: She is not in acute distress.    Appearance: Normal appearance. She is obese.  HENT:     Right Ear: Tympanic membrane normal.     Left Ear: Tympanic membrane normal.     Nose: Nose normal.     Mouth/Throat:     Mouth: Mucous membranes are moist.     Pharynx: Oropharynx is clear.  Cardiovascular:     Rate and Rhythm: Normal rate and regular rhythm.     Pulses: Normal pulses.     Heart sounds: Normal heart sounds.  Pulmonary:     Effort: Pulmonary effort is normal.     Breath sounds: Normal breath  sounds.  Abdominal:     General: Bowel sounds are normal.     Palpations: Abdomen is soft.     Tenderness: There is no abdominal tenderness.  Lymphadenopathy:     Cervical:     Right cervical: No superficial cervical adenopathy.    Left cervical: No superficial cervical adenopathy.  Skin:    General: Skin is warm and dry.     Capillary Refill: Capillary refill takes less than 2 seconds.  Neurological:     General: No focal deficit present.     Mental Status: She is alert. Mental status is at baseline.  Psychiatric:        Mood and Affect: Mood normal.        Behavior: Behavior normal.        Thought Content: Thought content normal.        Judgment: Judgment normal.      No results found for any visits on 01/11/23.     Assessment & Plan:    Routine Health Maintenance and Physical Exam  Immunization History  Administered Date(s) Administered   Tdap 02/05/2019    Health Maintenance  Topic Date Due   INFLUENZA VACCINE  02/26/2023 (Originally 06/28/2022)   PAP SMEAR-Modifier  10/28/2025   DTaP/Tdap/Td (2 - Td or Tdap) 02/04/2029   Hepatitis C Screening  Completed   HIV Screening  Completed   HPV VACCINES  Aged Out   COVID-19 Vaccine  Discontinued    Discussed health benefits of physical activity, and encouraged her to engage in regular exercise appropriate for her age and condition.  Problem List Items Addressed This Visit     Annual physical exam - Primary   Relevant Orders   CBC with Differential/Platelet   Comprehensive metabolic panel   Hemoglobin A1c   Lipid panel   TSH + free T4   Chronic migraine without aura    Reports 1 migraine since her last visit.  She has a neurology appointment on 02/14/2023 at 2:00 for further evaluation.  She is unable to go on propranolol due to birth control.  She Maxalt as needed migraine.  She will await neurology input for migraine management.      Depression, major, single episode, mild ([place])    History of depression and  anxiety since teenager.  PHQ 9 score today is 5.  Reports she has been given the name for a counselor, she needs to schedule with  this person.  Denies suicidal ideation, "I love my baby ".  Recommend she call and make an appointment with a counselor as soon as possible.     Routine labs ordered today. Up to date with pap.  HCM reviewed/discussed. Anticipatory guidance regarding healthy weight, lifestyle and choices given. Recommend healthy diet.  Recommend approximately 150 minutes/week of moderate intensity exercise. Limit alcohol consumption: no more than one drink per day for women and 2 drinks per day for me. Recommend regular dental and vision exams. Due for vision check.  Always use seatbelt/lap and shoulder restraints. Recommend using smoke alarms and checking batteries at least twice a year. Recommend using sunscreen when outside. She is interested in Healthy Weight and Wellness consult, she will consider making an appointment today.   Return in about 1 year (around 01/12/2024) for cpe.     Chalmers Guest, FNP

## 2023-01-11 NOTE — Assessment & Plan Note (Signed)
Reports 1 migraine since her last visit.  She has a neurology appointment on 02/14/2023 at 2:00 for further evaluation.  She is unable to go on propranolol due to birth control.  She Maxalt as needed migraine.  She will await neurology input for migraine management.

## 2023-01-11 NOTE — Assessment & Plan Note (Signed)
History of depression and anxiety since teenager.  PHQ 9 score today is 5.  Reports she has been given the name for a counselor, she needs to schedule with this person.  Denies suicidal ideation, "I love my baby ".  Recommend she call and make an appointment with a counselor as soon as possible.

## 2023-01-11 NOTE — Assessment & Plan Note (Signed)
[place] Office Visit from 12/21/2022 in [place] at [facility] Total Score 5

## 2023-01-12 LAB — COMPREHENSIVE METABOLIC PANEL
ALT: 11 IU/L (ref 0–32)
AST: 14 IU/L (ref 0–40)
Albumin/Globulin Ratio: 1.6 (ref 1.2–2.2)
Albumin: 4.1 g/dL (ref 3.9–4.9)
Alkaline Phosphatase: 106 IU/L (ref 44–121)
BUN/Creatinine Ratio: 14 (ref 9–23)
BUN: 10 mg/dL (ref 6–20)
Bilirubin Total: <0.2 mg/dL (ref 0.0–1.2)
CO2: 20 mmol/L (ref 20–29)
Calcium: 8.8 mg/dL (ref 8.7–10.2)
Chloride: 103 mmol/L (ref 96–106)
Creatinine, Ser: 0.69 mg/dL (ref 0.57–1.00)
Globulin, Total: 2.6 g/dL (ref 1.5–4.5)
Glucose: 82 mg/dL (ref 70–99)
Potassium: 4.4 mmol/L (ref 3.5–5.2)
Sodium: 137 mmol/L (ref 134–144)
Total Protein: 6.7 g/dL (ref 6.0–8.5)
eGFR: 116 mL/min/{1.73_m2} (ref >59)

## 2023-01-12 LAB — CBC WITH DIFFERENTIAL/PLATELET
Basophils Absolute: 0.1 10*3/uL (ref 0.0–0.2)
Basos: 1 %
EOS (ABSOLUTE): 0.1 10*3/uL (ref 0.0–0.4)
Eos: 2 %
Hematocrit: 39.9 % (ref 34.0–46.6)
Hemoglobin: 13.1 g/dL (ref 11.1–15.9)
Immature Grans (Abs): 0 10*3/uL (ref 0.0–0.1)
Immature Granulocytes: 0 %
Lymphocytes Absolute: 2.6 10*3/uL (ref 0.7–3.1)
Lymphs: 33 %
MCH: 27.2 pg (ref 26.6–33.0)
MCHC: 32.8 g/dL (ref 31.5–35.7)
MCV: 83 fL (ref 79–97)
Monocytes Absolute: 0.3 10*3/uL (ref 0.1–0.9)
Monocytes: 4 %
Neutrophils Absolute: 4.7 10*3/uL (ref 1.4–7.0)
Neutrophils: 60 %
Platelets: 307 10*3/uL (ref 150–450)
RBC: 4.81 x10E6/uL (ref 3.77–5.28)
RDW: 15.2 % (ref 11.7–15.4)
WBC: 7.8 10*3/uL (ref 3.4–10.8)

## 2023-01-12 LAB — LIPID PANEL
Chol/HDL Ratio: 2.8 ratio (ref 0.0–4.4)
Cholesterol, Total: 135 mg/dL (ref 100–199)
HDL: 49 mg/dL (ref >39)
LDL Chol Calc (NIH): 74 mg/dL (ref 0–99)
Triglycerides: 56 mg/dL (ref 0–149)
VLDL Cholesterol Cal: 12 mg/dL (ref 5–40)

## 2023-01-12 LAB — HEMOGLOBIN A1C
Est. average glucose Bld gHb Est-mCnc: 111 mg/dL
Hgb A1c MFr Bld: 5.5 % (ref 4.8–5.6)

## 2023-01-12 LAB — TSH+FREE T4
Free T4: 1.02 ng/dL (ref 0.82–1.77)
TSH: 1.79 u[IU]/mL (ref 0.450–4.500)

## 2023-01-27 ENCOUNTER — Ambulatory Visit (INDEPENDENT_AMBULATORY_CARE_PROVIDER_SITE_OTHER): Payer: Medicaid Other | Admitting: Gastroenterology

## 2023-01-27 ENCOUNTER — Encounter: Payer: Self-pay | Admitting: Gastroenterology

## 2023-01-27 VITALS — BP 120/70 | HR 97 | Ht 63.0 in | Wt 260.0 lb

## 2023-01-27 DIAGNOSIS — Z1211 Encounter for screening for malignant neoplasm of colon: Secondary | ICD-10-CM

## 2023-01-27 DIAGNOSIS — Z83719 Family history of colon polyps, unspecified: Secondary | ICD-10-CM | POA: Diagnosis not present

## 2023-01-27 DIAGNOSIS — Z01818 Encounter for other preprocedural examination: Secondary | ICD-10-CM | POA: Diagnosis not present

## 2023-01-27 NOTE — Patient Instructions (Signed)
_______________________________________________________  If your blood pressure at your visit was 140/90 or greater, please contact your primary care physician to follow up on this.  _______________________________________________________  If you are age 36 or older, your body mass index should be between 23-30. Your Body mass index is 46.06 kg/m. If this is out of the aforementioned range listed, please consider follow up with your Primary Care Provider.  If you are age 30 or younger, your body mass index should be between 19-25. Your Body mass index is 46.06 kg/m. If this is out of the aformentioned range listed, please consider follow up with your Primary Care Provider.   ________________________________________________________  The [facility] providers would like to encourage you to use [facility] to communicate with providers for non-urgent requests or questions.  Due to long hold times on the telephone, sending your provider a message by [facility] may be a faster and more efficient way to get a response.  Please allow 48 business hours for a response.  Please remember that this is for non-urgent requests.  _______________________________________________________  It was a pleasure to see you today!  Thank you for trusting me with your gastrointestinal care!

## 2023-01-27 NOTE — Progress Notes (Signed)
HPI : Bailey Schwartz is a very pleasant 37 year old female with a history of depression, migraines and obesity who is referred to our office by Pecolia Ades, FNP for consideration of early colon cancer screening.  The patient states that her mother's gastroenterologist recommended that her children get screened early, but did not specify at what age to start screening. The patient's mother has a history of colon polyps, but the daughter has no idea about any of the details of her polyp history, to include at what age she was first found to have polyps, how many, what size or what histology.  She does know that her mother was getting colonoscopies quite frequently (every 1-2 years) for a while. She does not know if she was advised to get genetic testing. She has no family history of colon cancer.  Her maternal grandmother was diagnosed with esophageal cancer.  She has no concerning GI symptoms.  She denies blood in her stool.  She has occasional crampy abdominal pain and has some variability in her stool consistency, but otherwise denies chronic GI symptoms. No upper Gi symptoms such as nausea/vomiting, dyspepsia, dysphagia or frequent heartburn/acid regurgitation.   Past Medical History:  Diagnosis Date   Depression    Genital warts    Gestational diabetes    History of abnormal cervical Pap smear    Hx of HPV   History of chicken pox    Migraine    Preeclampsia    Vaginal Pap smear, abnormal      Past Surgical History:  Procedure Laterality Date   CESAREAN SECTION N/A 09/18/2022   Procedure: CESAREAN SECTION;  Surgeon: Servando Salina, MD;  Location: [facility] LD [facility]S;  Service: Obstetrics;  Laterality: N/A;   REFRACTIVE SURGERY     Family History  Problem Relation Age of Onset   77 / Stillbirths Mother    Hypertension Mother    Diabetes Mother    Esophageal cancer Maternal Grandmother    Hyperlipidemia Maternal Grandmother    Hypertension Maternal Grandmother    Liver  cancer Maternal Grandfather    Aneurysm Maternal Grandfather    Hyperlipidemia Paternal Grandmother    Hypertension Paternal Grandmother    Hyperlipidemia Paternal Grandfather    Hypertension Paternal Grandfather    Stroke Paternal Grandfather    Social History   Tobacco Use   Smoking status: Former    Packs/day: 0.18    Types: Cigarettes   Smokeless tobacco: Never  Vaping Use   Vaping Use: Never used  Substance Use Topics   Alcohol use: Not Currently   Drug use: Not Currently   Current Outpatient Medications  Medication Sig Dispense Refill   INCASSIA 0.35 MG tablet Take 1 tablet by mouth daily. (Patient not taking: Reported on 01/11/2023)     JUNEL FE 1/20 1-20 MG-MCG tablet Take by mouth.     methylPREDNISolone (MEDROL DOSEPAK) 4 MG TBPK tablet 6-day pack as directed (Patient not taking: Reported on 01/11/2023) 21 tablet 0   Prenatal Vit-Fe Fumarate-FA (PRENATAL PO) Take 2 tablets by mouth daily.     rizatriptan (MAXALT) 5 MG tablet TAKE 1 TABLET BY MOUTH AS NEEDED FOR MIGRAINE. MAY REPEAT IN 2 HOURS IF NEEDED 8 tablet 0   No current facility-administered medications for this visit.   Allergies  Allergen Reactions   Sulfamethoxazole-Trimethoprim Hives, Swelling and Rash    Angioedema     Fluconazole Hives, Swelling and Rash     Review of Systems: All systems reviewed and negative except  where noted in HPI.    No results found.  Physical Exam: BP 120/70   Pulse 97   Ht '5\' 3"'$  (1.6 m)   Wt 260 lb (117.9 kg)   LMP  (LMP Unknown)   SpO2 99%   BMI 46.06 kg/m  Constitutional: Pleasant,well-developed, obese Latina female in no acute distress. HEENT: Normocephalic and atraumatic. Conjunctivae are normal. No scleral icterus. Cardiovascular: Normal rate, regular rhythm.  Pulmonary/chest: Effort normal and breath sounds normal. No wheezing, rales or rhonchi. Abdominal: Soft, nondistended, nontender. Bowel sounds active throughout. There are no masses palpable. No  hepatomegaly. Extremities: no edema Neurological: Alert and oriented to person place and time. Skin: Skin is warm and dry. No rashes noted. Psychiatric: Normal mood and affect. Behavior is normal.  CBC    Component Value Date/Time   WBC 7.8 01/11/2023 0850   WBC 12.5 (H) 09/19/2022 0418   RBC 4.81 01/11/2023 0850   RBC 3.75 (L) 09/19/2022 0418   HGB 13.1 01/11/2023 0850   HCT 39.9 01/11/2023 0850   PLT 307 01/11/2023 0850   MCV 83 01/11/2023 0850   MCH 27.2 01/11/2023 0850   MCH 26.4 09/19/2022 0418   MCHC 32.8 01/11/2023 0850   MCHC 31.2 09/19/2022 0418   RDW 15.2 01/11/2023 0850   LYMPHSABS 2.6 01/11/2023 0850   EOSABS 0.1 01/11/2023 0850   BASOSABS 0.1 01/11/2023 0850    CMP     Component Value Date/Time   NA 137 01/11/2023 0850   K 4.4 01/11/2023 0850   CL 103 01/11/2023 0850   CO2 20 01/11/2023 0850   GLUCOSE 82 01/11/2023 0850   GLUCOSE 108 (H) 11/10/2020 0000   BUN 10 01/11/2023 0850   CREATININE 0.69 01/11/2023 0850   CREATININE 0.74 11/10/2020 0000   CALCIUM 8.8 01/11/2023 0850   PROT 6.7 01/11/2023 0850   ALBUMIN 4.1 01/11/2023 0850   AST 14 01/11/2023 0850   ALT 11 01/11/2023 0850   ALKPHOS 106 01/11/2023 0850   BILITOT <0.2 01/11/2023 0850   GFRNONAA 107 11/10/2020 0000   GFRAA 124 11/10/2020 0000     ASSESSMENT AND PLAN: 37 year old female with a family history of colon polyps (mother), who was recommended by her mother's gastroenterologist to undergo early colon cancer screening, although no specific age was recommended.  I do not have any details regarding the mother's polyp history or at what age she was found to have polyps.   The patient will try to get these details from her mother, if possible.  If we are unable to obtain the details, I would recommend the patient start screening early at age 67.  I did offer a colonoscopy now, but it is not clear it is necessary without more information, I am not sure if it would be covered by insurance.  The  patient preferred to wait and get more information first.  Family history of colon polyps - Tentatively plan to start screening at age 9 - Get details from mother's previous colonoscopies to guide decision making  Santresa Levett E. Candis Schatz, MD [facility]   Victoriano Lain Craige Cotta, FNP

## 2023-02-14 ENCOUNTER — Ambulatory Visit (INDEPENDENT_AMBULATORY_CARE_PROVIDER_SITE_OTHER): Payer: BC Managed Care – PPO | Admitting: Psychiatry

## 2023-02-14 ENCOUNTER — Encounter: Payer: Self-pay | Admitting: Psychiatry

## 2023-02-14 VITALS — BP 113/64 | HR 96 | Ht 63.0 in | Wt 260.5 lb

## 2023-02-14 DIAGNOSIS — Z8249 Family history of ischemic heart disease and other diseases of the circulatory system: Secondary | ICD-10-CM | POA: Diagnosis not present

## 2023-02-14 DIAGNOSIS — G43719 Chronic migraine without aura, intractable, without status migrainosus: Secondary | ICD-10-CM

## 2023-02-14 MED ORDER — KETOROLAC TROMETHAMINE 60 MG/2ML IM SOLN
60.0000 mg | Freq: Once | INTRAMUSCULAR | Status: AC
  Administered 2023-02-14: 60 mg via INTRAMUSCULAR

## 2023-02-14 MED ORDER — UBRELVY 100 MG PO TABS: 100.0000 mg | ORAL_TABLET | ORAL | 6 refills | Status: DC | PRN

## 2023-02-14 MED ORDER — QULIPTA 60 MG PO TABS: 60.0000 mg | ORAL_TABLET | Freq: Every day | ORAL | 6 refills | Status: DC

## 2023-02-14 NOTE — Progress Notes (Addendum)
Referring:  Chalmers Guest, [place],  [place] [address]  PCP: Chalmers Guest, [place]  Neurology was asked to evaluate Bailey Schwartz, a 37 year old female for a chief complaint of headaches.  Our recommendations of care will be communicated by shared medical record.    CC:  headaches  History provided from self  HPI:  Medical co-morbidities: depression  The patient presents for evaluation of headaches which began over 10 years ago. She averages 1-2 migraines per week, with each migraine lasting 3-5 days at a time. Migraines are associated with photophobia and phonophobia. She will rarely have nausea.  She takes Maxalt as needed for her migraines. This has previously been effective, but seems to have lost efficacy lately. Has tried other triptans including Imitrex and Relpax in the past, but these also lost efficacy over time. Has taken Cymbalta and propranolol for prevention without improvement. She was prescribed Topamax for prevention in 2022 but is unsure if she took it or not.  She does note a family history of aneurysm which makes her concerned about potential aneurysm as a cause for her headaches.  Headache History: Onset: over 10 years ago Aura: no Location: unilateral, either side Associated Symptoms:  Photophobia: yes  Phonophobia: yes  Nausea: no Worse with activity?: yes Duration of headaches: 3-5 days  Migraine days per month: 20 Headache free days per month: 10  Current Treatment: Abortive Maxalt 5 mg PRN  Preventative none  Prior Therapies                                 Rescue: Rizatriptan 5 mg PRN - lack of efficacy Eletriptan 20 mg PRN - lack of efficacy Sumatriptan 100 mg PRN - lack of efficacy  Prevention: Propranolol 80 mg daily Cymbalta 40 mg daily  LABS: CBC    Component Value Date/Time   WBC 7.8 01/11/2023 0850   WBC 12.5 (H) 09/19/2022 0418   RBC 4.81 01/11/2023 0850   RBC 3.75 (L) 09/19/2022 0418   HGB 13.1  01/11/2023 0850   HCT 39.9 01/11/2023 0850   PLT 307 01/11/2023 0850   MCV 83 01/11/2023 0850   MCH 27.2 01/11/2023 0850   MCH 26.4 09/19/2022 0418   MCHC 32.8 01/11/2023 0850   MCHC 31.2 09/19/2022 0418   RDW 15.2 01/11/2023 0850   LYMPHSABS 2.6 01/11/2023 0850   EOSABS 0.1 01/11/2023 0850   BASOSABS 0.1 01/11/2023 0850      Latest Ref Rng & Units 01/11/2023    8:50 AM 11/10/2020   12:00 AM 12/31/2019   11:55 AM  CMP  Glucose 70 - 99 mg/dL 82  108  77   BUN 6 - 20 mg/dL 10  11  9    Creatinine 0.57 - 1.00 mg/dL 0.69  0.74  0.73   Sodium 134 - 144 mmol/L 137  137  139   Potassium 3.5 - 5.2 mmol/L 4.4  3.5  4.3   Chloride 96 - 106 mmol/L 103  103  104   CO2 20 - 29 mmol/L 20  23  26    Calcium 8.7 - 10.2 mg/dL 8.8  9.3  9.0   Total Protein 6.0 - 8.5 g/dL 6.7  7.2  7.5   Total Bilirubin 0.0 - 1.2 mg/dL <0.2  0.3  0.2   Alkaline Phos 44 - 121 IU/L 106     AST 0 - 40 IU/L 14  51  14   ALT 0 - 32 IU/L 11  123  14      IMAGING:  CTH 09/16/21: unremarkable  Imaging independently reviewed on February 14, 2023   Current Outpatient Medications on File Prior to Visit  Medication Sig Dispense Refill   JUNEL FE 1/20 1-20 MG-MCG tablet Take by mouth.     Prenatal Vit-Fe Fumarate-FA (PRENATAL PO) Take 2 tablets by mouth daily.     rizatriptan (MAXALT) 5 MG tablet TAKE 1 TABLET BY MOUTH AS NEEDED FOR MIGRAINE. MAY REPEAT IN 2 HOURS IF NEEDED 8 tablet 0   No current facility-administered medications on file prior to visit.     Allergies: Allergies  Allergen Reactions   Sulfamethoxazole-Trimethoprim Hives, Swelling and Rash    Angioedema     Fluconazole Hives, Swelling and Rash    Family History: Migraine or other headaches in the family:  aunts, paternal grandfather have migraines Aneurysms in a first degree relative:  father and paternal grandfather had aneurysms Brain tumors in the family:  no Other neurological illness in the family:   no  Past Medical History: Past  Medical History:  Diagnosis Date   Depression    Genital warts    Gestational diabetes    History of abnormal cervical Pap smear    Hx of HPV   History of chicken pox    Migraine    Preeclampsia    Vaginal Pap smear, abnormal     Past Surgical History Past Surgical History:  Procedure Laterality Date   CESAREAN SECTION N/A 09/18/2022   Procedure: CESAREAN SECTION;  Surgeon: Servando Salina, MD;  Location: [facility] LD [facility]S;  Service: Obstetrics;  Laterality: N/A;   REFRACTIVE SURGERY      Social History: Social History   Tobacco Use   Smoking status: Former    Packs/day: .18    Types: Cigarettes   Smokeless tobacco: Never  Vaping Use   Vaping Use: Never used  Substance Use Topics   Alcohol use: Not Currently   Drug use: Not Currently    ROS: Negative for fevers, chills. Positive for headaches. All other systems reviewed and negative unless stated otherwise in HPI.   Physical Exam:   Vital Signs: BP 113/64   Pulse 96   Ht 5\' 3"  (1.6 m)   Wt 260 lb 8 oz (118.2 kg)   BMI 46.15 kg/m  GENERAL: well appearing,in no acute distress,alert SKIN:  Color, texture, turgor normal. No rashes or lesions HEAD:  Normocephalic/atraumatic. CV:  RRR RESP: Normal respiratory effort MSK: no tenderness to palpation over occiput, neck, or shoulders  NEUROLOGICAL: Mental Status: Alert, oriented to person, place and time,Follows commands Cranial Nerves: PERRL, visual fields intact to confrontation, extraocular movements intact, facial sensation intact, no facial droop or ptosis, hearing grossly intact, no dysarthria Motor: muscle strength 5/5 both upper and lower extremities,no drift, normal tone Reflexes: 2+ throughout Sensation: intact to light touch all 4 extremities Coordination: Finger-to- nose-finger intact bilaterally Gait: normal-based   IMPRESSION: 37 year old female with a history of depression who presents for evaluation of chronic migraines. She is concerned about  potential aneurysm given positive family history. Will order CTA head to assess intracranial blood vessels. Will give Toradol shot today as she is actively experiencing a migraine. She has failed multiple triptans due to lack of efficacy. Will start Ubrelvy for migraine rescue. Discussed prevention options and she would prefer a daily pill that will not cause drowsiness or interact with birth control. Will  start [place] for migraine prevention.  PLAN: -CTA head -Prevention: Start Qulipta 60 mg daily -Rescue: Start Ubrelvy 100 mg PRN -Toradol shot given in office today   I spent a total of 35 minutes chart reviewing and counseling the patient. Headache education was done. Discussed medication side effects, adverse reactions and drug interactions. Written educational materials and patient instructions outlining all of the above were given.  Follow-up: 6 months   Genia Harold, MD 02/14/2023   2:22 PM

## 2023-02-14 NOTE — Addendum Note (Signed)
Addended by: Marcello Moores R on: 02/14/2023 02:48 PM   Modules accepted: Orders

## 2023-02-23 ENCOUNTER — Encounter: Payer: Self-pay | Admitting: Psychiatry

## 2023-02-23 ENCOUNTER — Telehealth: Payer: Self-pay | Admitting: Psychiatry

## 2023-02-23 NOTE — Telephone Encounter (Signed)
I send her insurance our office notes and this is what they sent back they need: doctor's notes that say two or more members of your family (mother, father, sister, brother or child) have a wide blood vessel in the brain (aneurysm). The notes could show the finding of another brain test. Those findings should show this test is also needed.

## 2023-03-02 ENCOUNTER — Encounter: Payer: Self-pay | Admitting: Family Medicine

## 2023-03-02 ENCOUNTER — Other Ambulatory Visit: Payer: Self-pay | Admitting: Psychiatry

## 2023-03-02 ENCOUNTER — Encounter: Payer: Self-pay | Admitting: Gastroenterology

## 2023-03-02 NOTE — Telephone Encounter (Signed)
Can we check to see if insurance will approve with a prior auth? It doesn't look like one has been submitted as far as I can tell

## 2023-03-03 ENCOUNTER — Other Ambulatory Visit: Payer: Self-pay

## 2023-03-03 DIAGNOSIS — Z0000 Encounter for general adult medical examination without abnormal findings: Secondary | ICD-10-CM

## 2023-03-03 DIAGNOSIS — D126 Benign neoplasm of colon, unspecified: Secondary | ICD-10-CM

## 2023-03-07 ENCOUNTER — Other Ambulatory Visit: Payer: Self-pay | Admitting: Family Medicine

## 2023-03-07 ENCOUNTER — Encounter: Payer: Self-pay | Admitting: Psychiatry

## 2023-03-07 ENCOUNTER — Other Ambulatory Visit: Payer: Self-pay

## 2023-03-07 DIAGNOSIS — G43001 Migraine without aura, not intractable, with status migrainosus: Secondary | ICD-10-CM

## 2023-03-07 MED ORDER — RIZATRIPTAN BENZOATE 5 MG PO TABS: ORAL_TABLET | ORAL | 0 refills | Status: DC

## 2023-03-08 NOTE — Telephone Encounter (Signed)
Message from patient: I received the denial from Medicaid for the CT. I was wondering if the request can be sent to my [employer]

## 2023-03-14 ENCOUNTER — Encounter: Payer: Self-pay | Admitting: Psychiatry

## 2023-03-15 MED ORDER — AMITRIPTYLINE HCL 25 MG PO TABS: 25.0000 mg | ORAL_TABLET | Freq: Every day | ORAL | 6 refills | Status: DC

## 2023-03-15 MED ORDER — ZOLMITRIPTAN 5 MG PO TABS: 5.0000 mg | ORAL_TABLET | ORAL | 6 refills | Status: DC | PRN

## 2023-03-15 NOTE — Telephone Encounter (Signed)
I sent an rx for daily amitriptyline for headache prevention and zomig for migraine rescue to her pharmacy. She can try these while we wait for insurance approval. Are there any updates on PA for ubrelvy/qulipta?

## 2023-03-22 NOTE — Telephone Encounter (Signed)
For prior authorization I had to fax office notes to [phone] case #[id number] for [facility].

## 2023-03-22 NOTE — Telephone Encounter (Signed)
Spoke to [employer] D. Call reference [phone]

## 2023-03-23 NOTE — Telephone Encounter (Signed)
[place] Numbers: [id number] exp. 03/22/23-06/21/23, medicaid denied and patient is aware, sent to GI [phone]

## 2023-03-29 ENCOUNTER — Telehealth: Payer: Self-pay | Admitting: *Deleted

## 2023-03-29 NOTE — Telephone Encounter (Signed)
PA done via cover my meds (Key: [id number]) - [id number].   Pending response

## 2023-04-02 ENCOUNTER — Other Ambulatory Visit: Payer: Self-pay | Admitting: Family Medicine

## 2023-04-02 DIAGNOSIS — G43001 Migraine without aura, not intractable, with status migrainosus: Secondary | ICD-10-CM

## 2023-04-10 ENCOUNTER — Telehealth: Payer: Self-pay | Admitting: Pharmacy Technician

## 2023-04-10 NOTE — Telephone Encounter (Signed)
Patient Advocate Encounter   Received notification that prior authorization for Ubrelvy 100MG  tablets is required.   PA submitted on 04/10/2023 [employer] [facility] [place] Medicaid Migraine Calcitonin Agents: Acute Treatment Prior Authorization Form Status is pending

## 2023-04-13 ENCOUNTER — Encounter: Payer: Self-pay | Admitting: Family Medicine

## 2023-04-14 ENCOUNTER — Other Ambulatory Visit: Payer: Self-pay | Admitting: Family Medicine

## 2023-04-14 ENCOUNTER — Other Ambulatory Visit (HOSPITAL_COMMUNITY): Payer: Self-pay

## 2023-04-14 DIAGNOSIS — Z789 Other specified health status: Secondary | ICD-10-CM

## 2023-04-14 MED ORDER — JUNEL FE 1/20 1-20 MG-MCG PO TABS
1.0000 | ORAL_TABLET | Freq: Every day | ORAL | 0 refills | Status: DC
Start: 2023-04-14 — End: 2023-05-09

## 2023-04-14 NOTE — Telephone Encounter (Signed)
Pharmacy Patient Advocate Encounter  Prior Authorization for Bernita Raisin 100MG  tablets has been approved by [employer] (ins).    PA # PA Case ID: [id number] Effective dates: 04/14/2023 through 07/15/2023

## 2023-04-14 NOTE — Progress Notes (Unsigned)
Blood pressure well controlled at annual wellness exam. Refill sent for one month Junel oral contraceptives which is usually managed per GYN.

## 2023-04-14 NOTE — Telephone Encounter (Signed)
Pharmacy Patient Advocate Encounter   Received notification that prior authorization for Ubrelvy 100MG  tablets is required/requested.   PA submitted on 04/14/2023 to (ins) [employer] via CoverMyMeds Key or (Medicaid) confirmation # [id number] Status is pending

## 2023-04-26 NOTE — Progress Notes (Signed)
REFERRING PROVIDER: Jenel Lucks, MD [address] [place],  [place] [address]  PRIMARY PROVIDER:  Novella Olive, FNP  PRIMARY REASON FOR VISIT:  Encounter Diagnoses  Name Primary?   Family history of colonic polyps Yes   Abnormal genetic test      HISTORY OF PRESENT ILLNESS:   Bailey Schwartz, a 37 y.o. female, was seen for a [facility] cancer genetics consultation at the request of Dr. Tomasa Rand due her family history colon polyps and previous direct-to-consumer (DTC) genetic testing results.  Bailey Schwartz presents to clinic today to discuss the possibility of a hereditary predisposition to cancer, to discuss genetic testing, and to further clarify her future cancer risks, as well as potential cancer risks for family members.   Bailey Schwartz is a 37 y.o. female with no personal history of cancer.  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    CANCER HISTORY:  Oncology History   No history exists.    RISK FACTORS:  Mammogram within the last year: no Number of breast biopsies: 0. Colonoscopy: no; not examined. Hysterectomy: no.  Ovaries intact: yes.  Menarche was at age 51.  First live birth at age 34.  OCP use for approximately  20  years.  Dermatology screening: no   FAMILY HISTORY:  We obtained a detailed, 4-generation family history.  Significant diagnoses are listed below: Family History  Problem Relation Age of Onset   Colon polyps Mother        unknown number   Colon polyps Father        less than 10 lifetime polyps   Bone cancer Maternal Uncle        or other primary? mother's paternal half brother; d. 53-70s   Prostate cancer Maternal Uncle        dx 60s-70s   Esophageal cancer Maternal Grandmother        or lymphoma? d. 41s               Bailey Schwartz reported that her mother's gastroenterologist recommended early colonoscopies for family members.  After speaking her mother, she  learned that her mother started colonoscopies in her 48s and typically has colonoscopies at least every 3 years.  Her mother is unaware of her lifetime number/type of colon polyps.   Bailey Schwartz is unaware of previous family history of genetic testing for hereditary cancer risks. There is no reported Ashkenazi Jewish ancestry. There is no known consanguinity.  GENETIC COUNSELING ASSESSMENT: Bailey Schwartz is a 37 y.o. 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 We, therefore, discussed and recommended the following at today's visit.    DISCUSSION: We discussed that polyps in general are common, however, most people have fewer than 5 lifetime polyps.  When an individual has 10 or more polyps we become concerned about an underlying polyposis syndrome.  The most common hereditary polyposis syndromes are Familial Adenomatous Polyposis (FAP), caused by mutations in the APC gene, and MUTYH-Associated Polyposis (MAP), caused by mutations in the MUTYH gene.  There are other genes that are associated with polyposis.  We discussed that testing is beneficial for several reasons, including knowing about cancer risks, identifying potential screening and risk-reduction options that may be appropriate, and to understand if other family members could be at risk for colon polyps and/or cancer and allow them to undergo genetic  testing.   We discussed that per the most recent NCCN guidelines, a single pathogenic variant in MUTYH is not known to increase cancer risk.  Biallelic mutations in MUTYH are associated with polyposis.  We discussed that [employer] testing is not comprehensive for MUTYH, as it analyzes two specific MUTYH variants rather than sequencing the entire gene.  Given her carrier result in MUTYH, we discussed that it is appropriate to have comprehensive MUTYH analysis.  We discussed that biallelic pathogenic variants in MUTYH  would warrant earlier and more frequent screening for colon cancer/polyps.   We reviewed the characteristics, features and inheritance patterns of hereditary cancer syndromes. We also discussed genetic testing, including the appropriate family members to test, the process of testing, insurance coverage and turn-around-time for results. We discussed the implications of a negative, positive, carrier and/or variant of uncertain significant result. We recommended Bailey Schwartz pursue genetic testing for MUTYH and other genes associated with cancer.  The Multi-Cancer + RNA Panel offered by [facility] includes sequencing and/or deletion/duplication analysis of the following 70 genes:  AIP*, ALK, APC*, ATM*, AXIN2*, BAP1*, BARD1*, BLM*, BMPR1A*, BRCA1*, BRCA2*, BRIP1*, CDC73*, CDH1*, CDK4, CDKN1B*, CDKN2A, CHEK2*, CTNNA1*, DICER1*, EPCAM (del/dup only), EGFR, FH*, FLCN*, GREM1 (promoter dup only), HOXB13, KIT, LZTR1, MAX*, MBD4, MEN1*, MET, MITF, MLH1*, MSH2*, MSH3*, MSH6*, MUTYH*, NF1*, NF2*, NTHL1*, PALB2*, PDGFRA, PMS2*, POLD1*, POLE*, POT1*, PRKAR1A*, PTCH1*, PTEN*, RAD51C*, RAD51D*, RB1*, RET, SDHA* (sequencing only), SDHAF2*, SDHB*, SDHC*, SDHD*, SMAD4*, SMARCA4*, SMARCB1*, SMARCE1*, STK11*, SUFU*, TMEM127*, TP53*, TSC1*, TSC2*, VHL*. RNA analysis is performed for * genes.  We discussed that if her out of pocket cost for testing is over $100, the laboratory should contact them to discuss self-pay options and/or patient pay assistance programs.   We discussed the Genetic Information Non-Discrimination Act (GINA) of 2008, which helps protect individuals against genetic discrimination based on their genetic test results.  It impacts both health insurance and employment.  With health insurance, it protects against genetic test results being used for increased premiums or policy termination. For employment, it protects against hiring, firing and promoting decisions based on genetic test results.  GINA does not apply to  those in the [employer], those who work for companies with less than 15 employees, and new life insurance or long-term disability insurance policies.  Health status due to a cancer diagnosis is not protected under GINA.  PLAN: After considering the risks, benefits, and limitations, Bailey Schwartz provided informed consent to pursue genetic testing and the blood sample was sent to [facility] for analysis of the Multi-Cancer +RNA Panel . Results should be available within approximately 3 weeks' time, at which point they will be disclosed by telephone to Bailey Schwartz, as will any additional recommendations warranted by these results. Bailey Schwartz will receive a summary of her genetic counseling visit and a copy of her results once available. This information will also be available in Epic.   Based on Bailey Schwartz family history, we recommended her mother have genetic counseling and testing if she learns that she has a history of more than 10 tubular adenomas. Bailey Schwartz will let us know if we can be of any assistance in coordinating genetic counseling and/or testing for this family member.   Bailey Schwartz questions were answered to her satisfaction today. Our contact information was provided should additional questions or concerns arise. Thank you for the referral and allowing [place] to share in the care of your patient.   Barbette Mcglaun M. Rennie Plowman, MS, [facility] Genetic Counselor [email or web address] [phone]) [phone]  The patient was seen for a total of 30 minutes in face-to-face genetic counseling.  The patient was seen alone.  Drs. Pamelia Hoit and/or Mosetta Putt were available to discuss this case as needed.  _______________________________________________________________________ For Office Staff:  Number of people involved in session: 1 Was an Intern/ student involved with case: no

## 2023-04-27 ENCOUNTER — Other Ambulatory Visit: Payer: Self-pay | Admitting: Genetic Counselor

## 2023-04-27 ENCOUNTER — Inpatient Hospital Stay: Payer: BC Managed Care – PPO | Admitting: Genetic Counselor

## 2023-04-27 ENCOUNTER — Inpatient Hospital Stay: Payer: BC Managed Care – PPO

## 2023-04-27 DIAGNOSIS — Z83719 Family history of colon polyps, unspecified: Secondary | ICD-10-CM

## 2023-04-27 DIAGNOSIS — R898 Other abnormal findings in specimens from other organs, systems and tissues: Secondary | ICD-10-CM

## 2023-04-27 LAB — GENETIC SCREENING ORDER

## 2023-04-28 ENCOUNTER — Ambulatory Visit
Admission: RE | Admit: 2023-04-28 | Discharge: 2023-04-28 | Disposition: A | Discharge status: Home / Self Care | Payer: BC Managed Care – PPO | Source: Ambulatory Visit | Attending: Psychiatry

## 2023-04-28 DIAGNOSIS — Z8249 Family history of ischemic heart disease and other diseases of the circulatory system: Secondary | ICD-10-CM

## 2023-04-28 MED ORDER — IOPAMIDOL (ISOVUE-370) INJECTION 76%
75.0000 mL | Freq: Once | INTRAVENOUS | Status: AC | PRN
  Administered 2023-04-28: 75 mL via INTRAVENOUS

## 2023-05-03 ENCOUNTER — Encounter: Payer: Self-pay | Admitting: Genetic Counselor

## 2023-05-08 ENCOUNTER — Encounter: Payer: Self-pay | Admitting: Psychiatry

## 2023-05-08 ENCOUNTER — Other Ambulatory Visit (HOSPITAL_COMMUNITY): Payer: Self-pay

## 2023-05-09 ENCOUNTER — Other Ambulatory Visit: Payer: Self-pay | Admitting: Family Medicine

## 2023-05-09 ENCOUNTER — Telehealth: Payer: Self-pay

## 2023-05-09 DIAGNOSIS — Z789 Other specified health status: Secondary | ICD-10-CM

## 2023-05-09 NOTE — Telephone Encounter (Signed)
Called as instructed by Dr. Marjory Lies to obtain results for cta head/neck routing to pod 1 to make 2 more attempts in regards to Bailey Schwartz pt

## 2023-05-10 NOTE — Telephone Encounter (Signed)
Debra/Tori, can you help get the images for MD to review?   Per Dr. Marjory Lies note in [place] message: "Pls call [facility] radiology to dictate the report. It was CTA head / neck. -VRP "

## 2023-05-15 ENCOUNTER — Telehealth: Payer: Self-pay

## 2023-05-15 NOTE — Telephone Encounter (Addendum)
LVM for pt to call back for her CT Results

## 2023-05-15 NOTE — Telephone Encounter (Signed)
Called husband and asked him to let his wife know to call us back for her CT Results.

## 2023-05-16 ENCOUNTER — Other Ambulatory Visit (HOSPITAL_COMMUNITY): Payer: Self-pay

## 2023-05-16 ENCOUNTER — Telehealth: Payer: Self-pay

## 2023-05-16 ENCOUNTER — Encounter: Payer: Self-pay | Admitting: Genetic Counselor

## 2023-05-16 ENCOUNTER — Telehealth: Payer: Self-pay | Admitting: Genetic Counselor

## 2023-05-16 DIAGNOSIS — G43719 Chronic migraine without aura, intractable, without status migrainosus: Secondary | ICD-10-CM

## 2023-05-16 DIAGNOSIS — Z1329 Encounter for screening for other suspected endocrine disorder: Secondary | ICD-10-CM

## 2023-05-16 DIAGNOSIS — Z1379 Encounter for other screening for genetic and chromosomal anomalies: Secondary | ICD-10-CM | POA: Insufficient documentation

## 2023-05-16 DIAGNOSIS — Z008 Encounter for other general examination: Secondary | ICD-10-CM | POA: Insufficient documentation

## 2023-05-16 HISTORY — DX: Encounter for screening for other suspected endocrine disorder: Z13.29

## 2023-05-16 NOTE — Telephone Encounter (Signed)
Contacted patient in attempt to disclose results of genetic testing.  LVM with contact information requesting a call back.  

## 2023-05-16 NOTE — Telephone Encounter (Signed)
Pharmacy Patient Advocate Encounter   Received notification from [employer] [place] [employer] that prior authorization for Ubrelvy 100MG  tablets is required/requested.   PA submitted to [facility] via CoverMyMeds Key or [facility]) confirmation # [id number] Status is pending

## 2023-05-17 NOTE — Telephone Encounter (Signed)
Took call from phone room and spoke w/ pt. Relayed results per Dr. Marjory Lies note: " CTA head is normal. Blood vessels look normal. 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 As a next step, please setup an eye doctor exam if not done in last 6-12 months. This can sometimes confirm or rule out high pressure in the brain. -VRP "   She will follow up with eye doctor for further evaluation.

## 2023-05-18 ENCOUNTER — Encounter: Payer: Self-pay | Admitting: Family Medicine

## 2023-05-18 ENCOUNTER — Encounter: Payer: Self-pay | Admitting: Psychiatry

## 2023-05-18 MED ORDER — METHYLPREDNISOLONE 4 MG PO TBPK
ORAL_TABLET | ORAL | 0 refills | Status: DC
Start: 2023-05-18 — End: 2023-08-21

## 2023-05-18 NOTE — Telephone Encounter (Signed)
Bailey Schwartz, I reviewed last OV note. There is documentation: "She averages 1-2 migraines per week" This would equal 8 migraines per month. Therefore, she didn't have more than 15 days per month of migraines.   Can this PA be resubmitted?

## 2023-05-18 NOTE — Telephone Encounter (Signed)
   Office visit 02/14/2023.  This is what is also documented on the most recent office note. I can try to resubmit but I would recommend updating the documentation to state one or the other-it is contradicting itself.

## 2023-05-18 NOTE — Telephone Encounter (Signed)
Bailey Schwartz, Dr. Delena Bali on leave for about another month, unable to update OV note.   I emailed pt and she reports 5 per month currently. Are you able to use this info to resubmit?

## 2023-05-18 NOTE — Telephone Encounter (Signed)
Pharmacy Patient Advocate Encounter  Received notification from [facility]  that the request for prior authorization for Ubrelvy 100MG  tablets has been denied due to see below.      Please be advised we currently do not have a Pharmacist to review denials, therefore you will need to process appeals accordingly as needed. Thanks for your support at this time.   You may call  or fax  to appeal.   PA#: [id number]  The denial letter has been placed in the chart under the media tab.

## 2023-05-19 ENCOUNTER — Other Ambulatory Visit (HOSPITAL_COMMUNITY): Payer: Self-pay

## 2023-05-19 NOTE — Telephone Encounter (Signed)
Pharmacy Patient Advocate Encounter   Received notification from [employer] that prior authorization for Ubrelvy 100MG  tablets is required/requested.   PA submitted to [facility] via CoverMyMeds Key or [facility]) confirmation # [id number] Status is pending

## 2023-05-19 NOTE — Telephone Encounter (Signed)
Error

## 2023-05-31 ENCOUNTER — Telehealth: Payer: Self-pay | Admitting: Genetic Counselor

## 2023-05-31 NOTE — Telephone Encounter (Signed)
Contacted patient in attempt to disclose results of genetic testing.  LVM with contact information requesting a call back.  Attempt 2.   

## 2023-06-05 ENCOUNTER — Telehealth: Payer: Self-pay | Admitting: Genetic Counselor

## 2023-06-05 ENCOUNTER — Ambulatory Visit: Payer: Self-pay | Admitting: Genetic Counselor

## 2023-06-05 DIAGNOSIS — Z83719 Family history of colon polyps, unspecified: Secondary | ICD-10-CM

## 2023-06-05 DIAGNOSIS — Z1379 Encounter for other screening for genetic and chromosomal anomalies: Secondary | ICD-10-CM

## 2023-06-05 NOTE — Telephone Encounter (Signed)
Third attempt at phone call to disclose genetics results.  LVM.  Sent MyChart message with results.

## 2023-06-05 NOTE — Progress Notes (Signed)
HPI: Ms. 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   Please refer to our prior cancer genetics clinic note for more information regarding Ms. Sundquist's medical, social and family histories, and our assessment and recommendations, at the time. Ms. Anderberg recent genetic test results were disclosed to her by MyChart message after unable to reach her phone. These results and recommendations are discussed in more detail below.    FAMILY HISTORY:  We obtained a detailed, 4-generation family history.  Significant diagnoses are listed below:      Family History  Problem Relation Age of Onset   Colon polyps Mother          unknown number   Colon polyps Father          less than 10 lifetime polyps   Bone cancer Maternal Uncle          or other primary? mother's paternal half brother; d. 15-70s   Prostate cancer Maternal Uncle          dx 60s-70s   Esophageal cancer Maternal Grandmother          or lymphoma? d. 78s           Ms. Brando reported that her mother's gastroenterologist recommended early colonoscopies for family members.  After speaking her mother, she learned that her mother started colonoscopies in her 57s and typically has colonoscopies at least every 3 years.  Her mother is unaware of her lifetime number/type of colon polyps.    Ms. Demedeiros is unaware of previous family history of genetic testing for hereditary cancer risks. There is no reported Ashkenazi Jewish ancestry. There is no known consanguinity.     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 Since Ms. Bourbeau has only one pathogenic mutation in MUTYH, she is NOT affected with MYH-associated polyposis, but instead is a carrier.    No  other pathogenic variants were identified in the [facility] Multi-Cancer +RNA Panel.  Report date is May 09, 2023.    The Multi-Cancer + RNA Panel offered by [facility] includes sequencing and/or deletion/duplication analysis of the following 70 genes:  AIP*, ALK, APC*, ATM*, AXIN2*, BAP1*, BARD1*, BLM*, BMPR1A*, BRCA1*, BRCA2*, BRIP1*, CDC73*, CDH1*, CDK4, CDKN1B*, CDKN2A, CHEK2*, CTNNA1*, DICER1*, EPCAM (del/dup only), EGFR, FH*, FLCN*, GREM1 (promoter dup only), HOXB13, KIT, LZTR1, MAX*, MBD4, MEN1*, MET, MITF, MLH1*, MSH2*, MSH3*, MSH6*, MUTYH*, NF1*, NF2*, NTHL1*, PALB2*, PDGFRA, PMS2*, POLD1*, POLE*, POT1*, PRKAR1A*, PTCH1*, PTEN*, RAD51C*, RAD51D*, RB1*, RET, SDHA* (sequencing only), SDHAF2*, SDHB*, SDHC*, SDHD*, SMAD4*, SMARCA4*, SMARCB1*, SMARCE1*, STK11*, SUFU*, TMEM127*, TP53*, TSC1*, TSC2*, VHL*. RNA analysis is performed for * genes.     A copy of the test report has been scanned into Epic and is located under the Molecular Pathology section of the Results Review tab.      Genetic testing did identify two variants of uncertain significance (VUS) - one in the KIT gene called c.577A>C (p.Lys193Gln) and a second in the [facility] gene called c.955G>A (p.Val319Ile).  At this time, it is unknown if these variants are associated with increased cancer risk or if they are normal findings, but most variants such as these get reclassified to being inconsequential. They should not be used to make medical management decisions. With time, we suspect the lab will  determine the significance of these variants, if any. If we do learn more about them, we will try to contact Ms. Getz to discuss it further. However, it is important to stay in touch with [place] periodically and keep the address and phone number up to date.  Of note, this result does not explain the family history of cancer/polyps.  Possible explanations for the cancer/polyps in the family may include: The cancers/polyps in the family may not be hereditary  but rather sporadic/familial or due to other genetic and environmental factors. There may be a gene mutation in one of these genes that current testing methods cannot detect but that chance is small. There could be another gene that has not yet been discovered, or that we have not yet tested, that is responsible for the cancer diagnoses in the family.  It is also possible there is a hereditary cause for the cancer/polyps in the family that Ms. Ginn did not inherit.  It is also possible that relatives in the family have biallelic MUTYH mutations that explain the colon polyps.    Therefore, it is important to remain in touch with cancer genetics in the future so that we can continue to offer Ms. Kolenovic the most up to date genetic testing.      MUTYH SCREENING RECOMMENDATIONS: We discussed the implications of a heterozygous MUTYH mutation for Ms. Younkin, and discussed who else in the family should have genetic testing. We recommended Ms. Tatro follow the most updated medical management guidelines (NCCN Guidelines v2.2023) for heterozygous MUTYH mutations; all of which are outlined below.    No known increased colon cancer risk for MUTYH heterozygotes  General population screening is appropriate for these individuals Manage based on personal history and first-degree family history     FAMILY MEMBERS: Since we now know the mutation in Ms. Holihan, we can test relatives to determine their MUTYH status. We will be happy to meet with any of the family members or refer them to a genetic counselor in their local area. To locate genetic counselors in other cities, individuals can visit the website of the [employer] ([email or web address]) and Financial controller for a Veterinary surgeon by zip code.   We discussed that her mother may be a good candidate for panel-based genetic testing if she has had more than 10 lifetime tubular adenomas.    We strongly encouraged Ms. Crickmore to remain in contact with [place] in cancer  genetics on an annual basis so we can update Ms. Scollard's personal and family histories, and inform her of advances in cancer genetics that may be of benefit for the entire family. Ms. Gatica knows she is also welcome to call with any questions or concerns, at any time.    Baily Hovanec M. Rennie Plowman, MS, [facility] Genetic Counselor [email or web address] (P) [phone]

## 2023-06-12 ENCOUNTER — Other Ambulatory Visit (HOSPITAL_COMMUNITY): Payer: Self-pay

## 2023-06-12 NOTE — Telephone Encounter (Signed)
Tried to resubmit but would not except due to previous denial. Will submit via PT other insurance which I believe is their primary-[employer].

## 2023-06-12 NOTE — Telephone Encounter (Signed)
Pharmacy Patient Advocate Encounter  Received notification from [facility] that Prior Authorization for Ubrelvy 100MG  tablets has been APPROVED from 06/12/2023 to 06/11/2024.Marland Kitchen  PA #/Case ID/Reference #: PA Case ID: [id number]

## 2023-06-12 NOTE — Telephone Encounter (Signed)
Pharmacy Patient Advocate Encounter   Received notification from [facility] that prior authorization for Ubrelvy 100MG  tablets is required/requested.   Insurance verification completed.   The patient is insured through [facility] .   PA has been submitted to [employer]-Awaiting determination.

## 2023-07-05 ENCOUNTER — Encounter: Payer: Self-pay | Admitting: Family Medicine

## 2023-08-17 ENCOUNTER — Ambulatory Visit: Payer: BC Managed Care – PPO | Admitting: Adult Health

## 2023-08-21 ENCOUNTER — Encounter: Payer: Self-pay | Admitting: Family Medicine

## 2023-08-21 ENCOUNTER — Ambulatory Visit: Payer: BC Managed Care – PPO | Admitting: Family Medicine

## 2023-08-21 VITALS — BP 111/76 | HR 67 | Temp 98.2°F | Resp 18 | Ht 63.5 in | Wt 252.0 lb

## 2023-08-21 DIAGNOSIS — Z008 Encounter for other general examination: Secondary | ICD-10-CM

## 2023-08-21 DIAGNOSIS — S76212A Strain of adductor muscle, fascia and tendon of left thigh, initial encounter: Secondary | ICD-10-CM | POA: Insufficient documentation

## 2023-08-21 HISTORY — DX: Strain of adductor muscle, fascia and tendon of left thigh, initial encounter: S76.212A

## 2023-08-21 MED ORDER — NAPROXEN 500 MG PO TABS: 500.0000 mg | ORAL_TABLET | Freq: Two times a day (BID) | ORAL | 0 refills | Status: DC

## 2023-08-21 NOTE — Assessment & Plan Note (Addendum)
Left groin pain present for 2 months. Pain is not constant and not severe. Will treat conservatively with naproxen 500 mg BID with food  for 2 weeks with alternating ice and heat. Follow-up if symptoms do not resolved.

## 2023-08-21 NOTE — Progress Notes (Signed)
Established Patient Office Visit  Subjective   Patient ID: Bailey Schwartz, female    DOB: September 28, 1986  Age: 37 y.o. MRN: [id number]  Chief Complaint  Patient presents with   biometric    Needs paper work completed for work  Left groin pain     HPI  Presents today for biometric paperwork for employer. Labs and CPE in February. Needs additional waist measurement today.   Left lower groin  pain that is radiating down left leg. Pain is not constant worse with walking at times. Feels like a pulled muscle. No bulge noted. Has not taken anything for pain.    Review of Systems  Musculoskeletal:  Negative for falls.       Left groin pain intermittent. No known injury.       Objective:     BP 111/76 (BP Location: Left Arm, Patient Position: Sitting, Cuff Size: Large)   Pulse 67   Temp 98.2 F (36.8 C) (Oral)   Resp 18   Ht 5' 3.5" (1.613 m)   Wt 252 lb (114.3 kg)   SpO2 98%   BMI 43.94 kg/m    Physical Exam Vitals and nursing note reviewed.  Constitutional:      General: Bailey Schwartz is not in acute distress.    Appearance: Normal appearance. Bailey Schwartz is obese. Bailey Schwartz is not ill-appearing.  Cardiovascular:     Rate and Rhythm: Normal rate.  Pulmonary:     Effort: Pulmonary effort is normal.  Musculoskeletal:        General: No swelling, deformity or signs of injury. Normal range of motion.     Comments: Left groin with intermittent discomfort. No bulge.   Skin:    General: Skin is warm and dry.  Neurological:     General: No focal deficit present.     Mental Status: Bailey Schwartz is alert. Mental status is at baseline.  Psychiatric:        Mood and Affect: Mood normal.        Behavior: Behavior normal.        Thought Content: Thought content normal.        Judgment: Judgment normal.     No results found for any visits on 08/21/23.    The ASCVD Risk score (Arnett DK, et al., 2019) failed to calculate for the following reasons:   The 2019 ASCVD risk score is only valid for ages 64 to  40    Assessment & Plan:   Problem List Items Addressed This Visit     Encounter for biometric screening - Primary    Required paperwork completed today. Lattie Corns will upload paperwork to her portal.       Strain of groin, left, initial encounter    Left groin pain present for 2 months. Pain is not constant and not severe. Will treat conservatively with naproxen 500 mg BID with food  for 2 weeks with alternating ice and heat. Follow-up if symptoms do not resolved.       Relevant Medications   naproxen (NAPROSYN) 500 MG tablet  Agrees with plan of care discussed.  Questions answered.   Return if symptoms worsen or fail to improve.    Novella Olive, FNP

## 2023-08-21 NOTE — Assessment & Plan Note (Addendum)
Required paperwork completed today. Bailey Schwartz will upload paperwork to her portal.

## 2023-09-11 ENCOUNTER — Encounter: Payer: Self-pay | Admitting: Adult Health

## 2023-09-11 MED ORDER — PREDNISONE 10 MG PO TABS: ORAL_TABLET | ORAL | 0 refills | Status: DC

## 2023-09-11 NOTE — Telephone Encounter (Signed)
Meds ordered this encounter  Medications   predniSONE (DELTASONE) 10 MG tablet    Sig: Take 60mg  on day 1. Reduce by 10mg  each subsequent day. (60, 50, 40, 30, 20, 10, stop)    Dispense:  21 tablet    Refill:  0    Suanne Marker, MD 09/11/2023, 3:44 PM Certified in Neurology, Neurophysiology and Neuroimaging  [facility] [address] [place], [place] [address] [phone]

## 2023-09-11 NOTE — Telephone Encounter (Signed)
I have not seen this patient before. Please send to POD 1 for them to address with work in [place]. Thank you.

## 2023-09-13 NOTE — Telephone Encounter (Signed)
[employer] form received. LVM for patient regarding form fee.

## 2023-09-14 DIAGNOSIS — Z0289 Encounter for other administrative examinations: Secondary | ICD-10-CM

## 2023-09-20 ENCOUNTER — Encounter: Payer: Self-pay | Admitting: Family Medicine

## 2023-10-04 ENCOUNTER — Telehealth: Payer: Self-pay | Admitting: *Deleted

## 2023-10-04 NOTE — Telephone Encounter (Signed)
Pt [employer] form faxed on 10/04/2023

## 2023-10-04 NOTE — Telephone Encounter (Signed)
Dr. Terrace Arabia signed FMLA. Gave completed/signed paperwork back to medical records to process for pt.

## 2023-11-01 ENCOUNTER — Encounter: Payer: Self-pay | Admitting: Family Medicine

## 2023-11-01 ENCOUNTER — Ambulatory Visit: Payer: BC Managed Care – PPO | Admitting: Family Medicine

## 2023-11-01 VITALS — BP 106/73 | HR 97 | Temp 99.5°F | Resp 18 | Ht 63.5 in | Wt 254.3 lb

## 2023-11-01 DIAGNOSIS — F419 Anxiety disorder, unspecified: Secondary | ICD-10-CM

## 2023-11-01 DIAGNOSIS — R2232 Localized swelling, mass and lump, left upper limb: Secondary | ICD-10-CM

## 2023-11-01 DIAGNOSIS — G4700 Insomnia, unspecified: Secondary | ICD-10-CM

## 2023-11-01 HISTORY — DX: Insomnia, unspecified: G47.00

## 2023-11-01 HISTORY — DX: Localized swelling, mass and lump, left upper limb: R22.32

## 2023-11-01 NOTE — Assessment & Plan Note (Signed)
This has improved since stopping medication per neuro.

## 2023-11-01 NOTE — Assessment & Plan Note (Addendum)
Will continue to monitor. No thoughts of self harm.

## 2023-11-01 NOTE — Assessment & Plan Note (Addendum)
Left axilla firmness with uncertain significance. [facility] and left axilla ultrasound.

## 2023-11-01 NOTE — Progress Notes (Signed)
   Established Patient Office Visit  Subjective   Patient ID: Bailey Schwartz, female    DOB: 1986-04-12  Age: 37 y.o. MRN: [id number]  Chief Complaint  Patient presents with   Breast Mass    Patient states that last week she noticed a lump under her left arm near breast     HPI  Left axilla lump:  Found lump under left axilla last week. Not painful.  Unsure how long this has been there. Nothing on the right side. No breast changes, no nipple discharge, no bleeding from nipple.  Concerned there there is something wrong.    Insomnia: believes the preventative medication for headaches was the cause of the insomnia. Stopped it and insomnia has resolved.   Anxiety: will monitor to see if symptoms were related to taking the Quilipta. No thoughts of self harm.   Review of Systems  Skin:        Left axilla firmness.       Objective:     BP 106/73   Pulse 97   Temp 99.5 F (37.5 C) (Oral)   Resp 18   Ht 5' 3.5" (1.613 m)   Wt 254 lb 4.8 oz (115.3 kg)   LMP  (Exact Date)   SpO2 97%   BMI 44.34 kg/m  BP Readings from Last 3 Encounters:  11/01/23 106/73  08/21/23 111/76  02/14/23 113/64      Physical Exam Vitals and nursing note reviewed.  Constitutional:      General: She is not in acute distress.    Appearance: Normal appearance. She is obese.  Pulmonary:     Effort: Pulmonary effort is normal.  Chest:     Comments: Firm area under left axilla. Not present on right.  Lymphadenopathy:     Upper Body:     Left upper body: Axillary adenopathy present.  Skin:    General: Skin is warm and dry.  Neurological:     General: No focal deficit present.     Mental Status: She is alert. Mental status is at baseline.  Psychiatric:        Mood and Affect: Mood normal.        Behavior: Behavior normal.        Thought Content: Thought content normal.        Judgment: Judgment normal.     No results found for any visits on 11/01/23.    The ASCVD Risk score (Arnett  DK, et al., 2019) failed to calculate for the following reasons:   The 2019 ASCVD risk score is only valid for ages 61 to 48    Assessment & Plan:   Problem List Items Addressed This Visit     Anxiety    Will continue to monitor. No thoughts of self harm.       Axillary lump, left - Primary    Left axilla firmness with uncertain significance. [facility] and left axilla ultrasound.       Relevant Orders   MM Digital Diagnostic Unilat L   [place] AXILLA LEFT   Insomnia    This has improved since stopping medication per neuro.      Agrees with plan of care discussed.  Questions answered.   Return if symptoms worsen or fail to improve.    Novella Olive, FNP

## 2023-11-02 ENCOUNTER — Ambulatory Visit
Admission: RE | Admit: 2023-11-02 | Discharge: 2023-11-02 | Disposition: A | Discharge status: Home / Self Care | Payer: BC Managed Care – PPO | Source: Ambulatory Visit | Attending: Family Medicine | Admitting: Family Medicine

## 2023-11-02 ENCOUNTER — Encounter: Payer: Self-pay | Admitting: Family Medicine

## 2023-11-02 NOTE — Telephone Encounter (Signed)
Copied from CRM [id number]. Topic: Referral - Question >> Nov 02, 2023 10:06 AM Alvino Blood C wrote:  Reason for CRM: DESTINY (WITH MAMMOGRAM IMAGING ) CALLED TO GET DR. TO SIGN DIAGNOSTIC MAMMOGRAM PORTION OF PAPERWORK. THE FORM CAN BE SENT FAX TO [phone] OR PAPER ORDER. PT HAS AN APPT TODAY AT 11 THEY NEED IT BEFORE THEN.

## 2023-11-09 ENCOUNTER — Encounter: Payer: Self-pay | Admitting: Adult Health

## 2023-11-09 ENCOUNTER — Ambulatory Visit: Payer: BC Managed Care – PPO | Admitting: Adult Health

## 2023-11-09 VITALS — BP 131/85 | HR 87

## 2023-11-09 DIAGNOSIS — G43719 Chronic migraine without aura, intractable, without status migrainosus: Secondary | ICD-10-CM

## 2023-11-09 DIAGNOSIS — H93A9 Pulsatile tinnitus, unspecified ear: Secondary | ICD-10-CM | POA: Diagnosis not present

## 2023-11-09 DIAGNOSIS — E236 Other disorders of pituitary gland: Secondary | ICD-10-CM | POA: Diagnosis not present

## 2023-11-09 MED ORDER — AMITRIPTYLINE HCL 50 MG PO TABS
50.0000 mg | ORAL_TABLET | Freq: Every day | ORAL | 5 refills | Status: DC
Start: 2023-11-09 — End: 2024-05-09

## 2023-11-09 NOTE — Progress Notes (Signed)
Referring:  Novella Olive, FNP [address] [place],  [place] [address]  PCP: Novella Olive, FNP   CC:  headaches  History provided from self  Follow-up visit:  Prior visit: 02/14/2023 Dr. Delena Bali (initial consult visit)  Brief HPI:   Bailey Schwartz is a 37 y.o. female who is being followed for chronic migraine headaches which began over 10 years ago.  At initial visit with Dr. Delena Bali in 01/2023, she reported about 20 migraine days per month associated with photophobia and phonophobia.  She was started on [place] for prevention and Ubrelvy for rescue.  She also mention family history of cerebral aneurysm therefore proceeded with CTA head for further evaluation.  During the interval time, she was placed on amitriptyline for prevention and Zomig for rescue while waiting on insurance approval of [place].  She had prolonged migraine in June and October requiring steroid Dosepak which aborts prolonged migraine.     Interval history:  Returns for follow-up visit.  Reports some improvement of migraines since prior visit. Currently occurring around her menstrual cycle, usually the week prior and week after. She does not track migraines so unsure of frequency, migraines generally last 2 days if not longer. Did try Qulipta for 1 month but caused insomnia and lack of appetite which was triggering migraines.  She has remained on amitriptyline 25 mg nightly, tolerating well.  Varied benefit with Zomig, has been using Bernita Raisin recently and notes benefit with resolution in a few hours.  Her job consists of primarily computer work, does use bluelight filter glasses.  CTA head showed partially empty sella otherwise unremarkable.  She was seen by optometrist late summer/early fall who told her she had normal IOP but denies extensive evaluation.  She does report pulsatile tinnitus, denies any visual changes or concerns.     Headache History: Onset: over 10 years ago Aura: no Location:  unilateral, either side Associated Symptoms:  Photophobia: yes  Phonophobia: yes  Nausea: no Worse with activity?: yes Duration of headaches: 2-5 days    Current Treatment: Abortive Ubrelvy 100mg  PRN  Preventative Amitriptyline 25 mg nightly  Prior Therapies                                 Rescue: Rizatriptan 5 mg PRN - lack of efficacy Eletriptan 20 mg PRN - lack of efficacy Sumatriptan 100 mg PRN - lack of efficacy Zomig - lack of efficacy Ubrelvy  Prevention: Propranolol 80 mg daily Cymbalta 40 mg daily Qulipta -intolerant Amitriptyline  LABS: CBC    Component Value Date/Time   WBC 7.8 01/11/2023 0850   WBC 12.5 (H) 09/19/2022 0418   RBC 4.81 01/11/2023 0850   RBC 3.75 (L) 09/19/2022 0418   HGB 13.1 01/11/2023 0850   HCT 39.9 01/11/2023 0850   PLT 307 01/11/2023 0850   MCV 83 01/11/2023 0850   MCH 27.2 01/11/2023 0850   MCH 26.4 09/19/2022 0418   MCHC 32.8 01/11/2023 0850   MCHC 31.2 09/19/2022 0418   RDW 15.2 01/11/2023 0850   LYMPHSABS 2.6 01/11/2023 0850   EOSABS 0.1 01/11/2023 0850   BASOSABS 0.1 01/11/2023 0850      Latest Ref Rng & Units 01/11/2023    8:50 AM 11/10/2020   12:00 AM 12/31/2019   11:55 AM  CMP  Glucose 70 - 99 mg/dL 82  604  77   BUN 6 - 20 mg/dL 10  11  9   Creatinine 0.57 - 1.00 mg/dL 8.46  9.62  9.52   Sodium 134 - 144 mmol/L 137  137  139   Potassium 3.5 - 5.2 mmol/L 4.4  3.5  4.3   Chloride 96 - 106 mmol/L 103  103  104   CO2 20 - 29 mmol/L 20  23  26    Calcium 8.7 - 10.2 mg/dL 8.8  9.3  9.0   Total Protein 6.0 - 8.5 g/dL 6.7  7.2  7.5   Total Bilirubin 0.0 - 1.2 mg/dL <8.4  0.3  0.2   Alkaline Phos 44 - 121 IU/L 106     AST 0 - 40 IU/L 14  51  14   ALT 0 - 32 IU/L 11  123  14      IMAGING:  CTH 09/16/21: unremarkable  Imaging independently reviewed on November 09, 2023   Current Outpatient Medications on File Prior to Visit  Medication Sig Dispense Refill   amitriptyline (ELAVIL) 25 MG tablet Take 1 tablet (25  mg total) by mouth at bedtime. 30 tablet 6   JUNEL FE 1/20 1-20 MG-MCG tablet TAKE 1 TABLET BY MOUTH EVERY DAY 28 tablet 0   naproxen (NAPROSYN) 500 MG tablet Take 1 tablet (500 mg total) by mouth 2 (two) times daily with a meal. 28 tablet 0   predniSONE (DELTASONE) 10 MG tablet Take 60mg  on day 1. Reduce by 10mg  each subsequent day. (60, 50, 40, 30, 20, 10, stop) 21 tablet 0   Prenatal Vit-Fe Fumarate-FA (PRENATAL PO) Take 2 tablets by mouth daily.     zolmitriptan (ZOMIG) 5 MG tablet Take 1 tablet (5 mg total) by mouth as needed for migraine. 10 tablet 6   No current facility-administered medications on file prior to visit.     Allergies: Allergies  Allergen Reactions   Sulfamethoxazole-Trimethoprim Hives, Swelling and Rash    Angioedema     Fluconazole Hives, Swelling and Rash    Family History: Migraine or other headaches in the family:  aunts, paternal grandfather have migraines Aneurysms in a first degree relative:  father and paternal grandfather had aneurysms Brain tumors in the family:  no Other neurological illness in the family:   no  Past Medical History: Past Medical History:  Diagnosis Date   Depression    Genital warts    Gestational diabetes    History of abnormal cervical Pap smear    Hx of HPV   History of chicken pox    Migraine    Preeclampsia    Vaginal Pap smear, abnormal     Past Surgical History Past Surgical History:  Procedure Laterality Date   CESAREAN SECTION N/A 09/18/2022   Procedure: CESAREAN SECTION;  Surgeon: Maxie Better, MD;  Location: [facility] LD ORS;  Service: Obstetrics;  Laterality: N/A;   REFRACTIVE SURGERY      Social History: Social History   Tobacco Use   Smoking status: Former    Current packs/day: 0.18    Types: Cigarettes   Smokeless tobacco: Never  Vaping Use   Vaping status: Never Used  Substance Use Topics   Alcohol use: Not Currently   Drug use: Not Currently    ROS: Negative for fevers, chills.  Positive for headaches. All other systems reviewed and negative unless stated otherwise in HPI.   Physical Exam:   Vital Signs: BP 131/85 (BP Location: Right Arm, Patient Position: Sitting, Cuff Size: Normal)   Pulse 87   LMP 10/18/2023  GENERAL: well  appearing,in no acute distress,alert SKIN:  Color, texture, turgor normal. No rashes or lesions HEAD:  Normocephalic/atraumatic. CV:  RRR RESP: Normal respiratory effort   NEUROLOGICAL: Mental Status: Alert, oriented to person, place and time,Follows commands Cranial Nerves: PERRL, visual fields intact to confrontation, extraocular movements intact, facial sensation intact, no facial droop or ptosis, hearing grossly intact, no dysarthria Motor: muscle strength 5/5 both upper and lower extremities,no drift, normal tone Reflexes: 2+ throughout Gait: normal-based     IMPRESSION: 37 year old female with a history of depression who returns for follow-up of chronic migraines.  Reports some improvement of migraines on amitriptyline and use of Ubrelvy with benefit. 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    PLAN: -Prevention: Increase amitriptyline to 50 mg nightly -Rescue: Continue Ubrelvy 100 mg PRN -Referral placed to ophthalmology Dr. Dione Booze to evaluate for papilledema    Follow-up in 6 months or call earlier if needed    I spent 30 minutes of face-to-face and non-face-to-face time with patient.  This included previsit chart review, lab review, study review, order entry, electronic health record documentation, patient education and discussion regarding above diagnoses and treatment plan and answered all other questions to patient's satisfaction  Ihor Austin, [facility]  [facility] [address] [place], [place] [address]  Phone [phone] Fax [phone] Note: This document was prepared with digital dictation and  possible smart phrase technology. Any transcriptional errors that result from this process are unintentional.

## 2023-11-09 NOTE — Patient Instructions (Signed)
Your Plan:  Increase amitriptyline to 50mg  nightly  Continue Ubrelvy as needed, can repeat x1 after 2 hours if needed  Referral placed to ophthalmology Dr. Dione Booze for full vision exam       Follow up in 6 months or call earlier if needed     Thank you for coming to see [place] at [facility]. I hope we have been able to provide you high quality care today.  You may receive a patient satisfaction survey over the next few weeks. We would appreciate your feedback and comments so that we may continue to improve ourselves and the health of our patients.

## 2023-11-16 ENCOUNTER — Telehealth: Payer: Self-pay

## 2023-11-16 ENCOUNTER — Ambulatory Visit: Payer: BC Managed Care – PPO | Admitting: Family Medicine

## 2023-11-16 NOTE — Telephone Encounter (Signed)
Referral sent to [facility] [phone]   [phone]

## 2024-01-05 ENCOUNTER — Encounter: Payer: Self-pay | Admitting: Adult Health

## 2024-01-05 DIAGNOSIS — R519 Headache, unspecified: Secondary | ICD-10-CM

## 2024-01-08 NOTE — Telephone Encounter (Signed)
 LVM for pt to call back to discuss mychart message

## 2024-01-15 ENCOUNTER — Encounter: Payer: BC Managed Care – PPO | Admitting: Family Medicine

## 2024-01-16 ENCOUNTER — Encounter: Payer: Self-pay | Admitting: Adult Health

## 2024-01-17 MED ORDER — METHYLPREDNISOLONE 4 MG PO TBPK
ORAL_TABLET | ORAL | 0 refills | Status: DC
Start: 2024-01-17 — End: 2024-05-09

## 2024-01-17 NOTE — Addendum Note (Signed)
Addended by: Ihor Austin L on: 01/17/2024 11:53 AM   Modules accepted: Orders

## 2024-02-02 ENCOUNTER — Ambulatory Visit (INDEPENDENT_AMBULATORY_CARE_PROVIDER_SITE_OTHER): Payer: BC Managed Care – PPO | Admitting: Family Medicine

## 2024-02-02 ENCOUNTER — Encounter: Payer: Self-pay | Admitting: Family Medicine

## 2024-02-02 VITALS — BP 115/88 | HR 102 | Temp 98.8°F | Ht 63.5 in | Wt 249.0 lb

## 2024-02-02 DIAGNOSIS — Z136 Encounter for screening for cardiovascular disorders: Secondary | ICD-10-CM

## 2024-02-02 DIAGNOSIS — Z13228 Encounter for screening for other metabolic disorders: Secondary | ICD-10-CM | POA: Diagnosis not present

## 2024-02-02 DIAGNOSIS — Z1329 Encounter for screening for other suspected endocrine disorder: Secondary | ICD-10-CM

## 2024-02-02 DIAGNOSIS — L2082 Flexural eczema: Secondary | ICD-10-CM

## 2024-02-02 DIAGNOSIS — Z0000 Encounter for general adult medical examination without abnormal findings: Secondary | ICD-10-CM

## 2024-02-02 DIAGNOSIS — Z13220 Encounter for screening for lipoid disorders: Secondary | ICD-10-CM

## 2024-02-02 MED ORDER — TRIAMCINOLONE ACETONIDE 0.1 % EX CREA: 1.0000 | TOPICAL_CREAM | Freq: Two times a day (BID) | CUTANEOUS | 0 refills | Status: AC

## 2024-02-02 NOTE — Progress Notes (Signed)
 Complete physical exam  Patient: Bailey Schwartz   DOB: 18-Dec-1985   38 y.o. Female  MRN: [id number]  Subjective:    Chief Complaint  Patient presents with   Annual Exam    No pap. Pap 1/25.    Bailey Schwartz is a 38 y.o. female who presents today for a complete physical exam. She reports consuming a general diet. Home exercise routine includes peloton bike. She generally feels well. She reports sleeping well. She does not have additional problems to discuss today.    Most recent fall risk assessment:    12/21/2022   10:14 AM  Fall Risk   Falls in the past year? 0  Number falls in past yr: 0  Injury with Fall? 0  Risk for fall due to : No Fall Risks  Follow up Falls evaluation completed     Most recent depression screenings:    04/27/2023    2:59 PM 12/21/2022   10:13 AM  PHQ 2/9 Scores  PHQ - 2 Score 2 1  PHQ- 9 Score  5    Vision:Within last year and Dental: No current dental problems and Receives regular dental care    Patient Care Team: Novella Olive, FNP as PCP - General (Family Medicine)   Outpatient Medications Prior to Visit  Medication Sig   amitriptyline (ELAVIL) 50 MG tablet Take 1 tablet (50 mg total) by mouth at bedtime.   JUNEL FE 1/20 1-20 MG-MCG tablet TAKE 1 TABLET BY MOUTH EVERY DAY   Ubrogepant (UBRELVY) 100 MG TABS Take 100 mg by mouth as needed (migraine).   zolmitriptan (ZOMIG) 5 MG tablet Take 1 tablet (5 mg total) by mouth as needed for migraine.   methylPREDNISolone (MEDROL DOSEPAK) 4 MG TBPK tablet Taper pack as directed (Patient not taking: Reported on 02/02/2024)   No facility-administered medications prior to visit.    ROS        Objective:     BP 115/88 (BP Location: Right Arm, Patient Position: Sitting, Cuff Size: Large)   Pulse (!) 102   Temp 98.8 F (37.1 C) (Oral)   Ht 5' 3.5" (1.613 m)   Wt 249 lb (112.9 kg)   LMP 01/11/2024 (Approximate)   SpO2 98%   BMI 43.42 kg/m  BP Readings from Last 3 Encounters:  02/02/24  115/88  11/09/23 131/85  11/01/23 106/73      Physical Exam Vitals and nursing note reviewed.  Constitutional:      General: She is not in acute distress.    Appearance: Normal appearance.  HENT:     Right Ear: Tympanic membrane normal.     Left Ear: Tympanic membrane normal.     Nose: Nose normal.     Mouth/Throat:     Mouth: Mucous membranes are moist.     Pharynx: Oropharynx is clear.  Eyes:     Extraocular Movements: Extraocular movements intact.  Neck:     Thyroid: No thyroid tenderness.  Cardiovascular:     Rate and Rhythm: Normal rate and regular rhythm.     Pulses:          Radial pulses are 2+ on the right side and 2+ on the left side.     Heart sounds: Normal heart sounds, S1 normal and S2 normal.  Pulmonary:     Effort: Pulmonary effort is normal.     Breath sounds: Normal breath sounds.  Abdominal:     General: Bowel sounds are normal.     Palpations: Abdomen  is soft.     Tenderness: There is no abdominal tenderness.  Musculoskeletal:        General: Normal range of motion.     Cervical back: Normal range of motion.     Right lower leg: No edema.     Left lower leg: No edema.  Lymphadenopathy:     Cervical:     Right cervical: No superficial cervical adenopathy.    Left cervical: No superficial cervical adenopathy.  Skin:    General: Skin is warm and dry.  Neurological:     General: No focal deficit present.     Mental Status: She is alert. Mental status is at baseline.  Psychiatric:        Mood and Affect: Mood normal.        Behavior: Behavior normal.        Thought Content: Thought content normal.        Judgment: Judgment normal.      No results found for any visits on 02/02/24.     Assessment & Plan:    Routine Health Maintenance and Physical Exam  Immunization History  Administered Date(s) Administered   Tdap 02/05/2019    Health Maintenance  Topic Date Due   INFLUENZA VACCINE  02/26/2024 (Originally 06/29/2023)   Cervical Cancer  Screening (HPV/Pap Cotest)  11/15/2028   DTaP/Tdap/Td (2 - Td or Tdap) 02/04/2029   Hepatitis C Screening  Completed   HIV Screening  Completed   HPV VACCINES  Aged Out   COVID-19 Vaccine  Discontinued    Discussed health benefits of physical activity, and encouraged her to engage in regular exercise appropriate for her age and condition.  Annual physical exam -     CBC with Differential/Platelet -     Comprehensive metabolic panel  Encounter for lipid screening for cardiovascular disease -     Lipid panel  Encounter for screening for metabolic disorder -     Comprehensive metabolic panel -     Hemoglobin A1c  Screening for thyroid disorder -     TSH + free T4  Flexural eczema -     Triamcinolone Acetonide; Apply 1 Application topically 2 (two) times daily.  Dispense: 30 g; Refill: 0      Routine labs ordered.  HCM reviewed/discussed. Pap up to date. Anticipatory guidance regarding healthy weight, lifestyle and choices given. Recommend healthy diet.  Recommend approximately 150 minutes/week of moderate intensity exercise. Resistance training is good for building muscles and for bone health. Muscle mass helps to increase our metabolism and to burn more calories at rest.  Limit alcohol consumption: no more than one drink per day for women and 2 drinks per day for me. Recommend regular dental and vision exams. Always use seatbelt/lap and shoulder restraints. Recommend using smoke alarms and checking batteries at least twice a year. Recommend using sunscreen when outside.  Please know that I am here to help you with all of your health care goals and am happy to work with you to find a solution that works best for you.  The greatest advice I have received with any changes in life are to take it one step at a time, that even means if all you can focus on is the next 60 seconds, then do that and celebrate your victories.  With any changes in life, you will have set backs, and that  is OK. The important thing to remember is, if you have a set back, it is not a failure,  it is an opportunity to try again! Agrees with plan of care discussed.  Questions answered.      Return in about 1 year (around 02/01/2025) for CPE with labs.     Novella Olive, FNP

## 2024-02-03 LAB — COMPREHENSIVE METABOLIC PANEL
ALT: 17 IU/L (ref 0–32)
AST: 18 IU/L (ref 0–40)
Albumin: 4 g/dL (ref 3.9–4.9)
Alkaline Phosphatase: 84 IU/L (ref 44–121)
BUN/Creatinine Ratio: 10 (ref 9–23)
BUN: 9 mg/dL (ref 6–20)
Bilirubin Total: 0.3 mg/dL (ref 0.0–1.2)
CO2: 21 mmol/L (ref 20–29)
Calcium: 9.2 mg/dL (ref 8.7–10.2)
Chloride: 104 mmol/L (ref 96–106)
Creatinine, Ser: 0.92 mg/dL (ref 0.57–1.00)
Globulin, Total: 2.8 g/dL (ref 1.5–4.5)
Glucose: 91 mg/dL (ref 70–99)
Potassium: 4.5 mmol/L (ref 3.5–5.2)
Sodium: 139 mmol/L (ref 134–144)
Total Protein: 6.8 g/dL (ref 6.0–8.5)
eGFR: 83 mL/min/{1.73_m2} (ref >59)

## 2024-02-03 LAB — LIPID PANEL
Chol/HDL Ratio: 3.9 ratio (ref 0.0–4.4)
Cholesterol, Total: 172 mg/dL (ref 100–199)
HDL: 44 mg/dL (ref >39)
LDL Chol Calc (NIH): 112 mg/dL — ABNORMAL HIGH (ref 0–99)
Triglycerides: 87 mg/dL (ref 0–149)
VLDL Cholesterol Cal: 16 mg/dL (ref 5–40)

## 2024-02-03 LAB — CBC WITH DIFFERENTIAL/PLATELET
Basophils Absolute: 0.1 10*3/uL (ref 0.0–0.2)
Basos: 1 %
EOS (ABSOLUTE): 0.1 10*3/uL (ref 0.0–0.4)
Eos: 1 %
Hematocrit: 43.5 % (ref 34.0–46.6)
Hemoglobin: 14.2 g/dL (ref 11.1–15.9)
Immature Grans (Abs): 0 10*3/uL (ref 0.0–0.1)
Immature Granulocytes: 0 %
Lymphocytes Absolute: 2.8 10*3/uL (ref 0.7–3.1)
Lymphs: 30 %
MCH: 29.1 pg (ref 26.6–33.0)
MCHC: 32.6 g/dL (ref 31.5–35.7)
MCV: 89 fL (ref 79–97)
Monocytes Absolute: 0.4 10*3/uL (ref 0.1–0.9)
Monocytes: 4 %
Neutrophils Absolute: 6 10*3/uL (ref 1.4–7.0)
Neutrophils: 64 %
Platelets: 318 10*3/uL (ref 150–450)
RBC: 4.88 x10E6/uL (ref 3.77–5.28)
RDW: 12.7 % (ref 11.7–15.4)
WBC: 9.3 10*3/uL (ref 3.4–10.8)

## 2024-02-03 LAB — TSH+FREE T4
Free T4: 1.04 ng/dL (ref 0.82–1.77)
TSH: 1.97 u[IU]/mL (ref 0.450–4.500)

## 2024-02-03 LAB — HEMOGLOBIN A1C
Est. average glucose Bld gHb Est-mCnc: 105 mg/dL
Hgb A1c MFr Bld: 5.3 % (ref 4.8–5.6)

## 2024-02-04 ENCOUNTER — Encounter: Payer: Self-pay | Admitting: Family Medicine

## 2024-02-21 ENCOUNTER — Ambulatory Visit: Payer: BC Managed Care – PPO | Admitting: Adult Health

## 2024-03-07 ENCOUNTER — Encounter: Payer: Self-pay | Admitting: Family Medicine

## 2024-03-07 ENCOUNTER — Other Ambulatory Visit: Payer: Self-pay | Admitting: Family Medicine

## 2024-03-07 DIAGNOSIS — D229 Melanocytic nevi, unspecified: Secondary | ICD-10-CM

## 2024-03-07 HISTORY — DX: Melanocytic nevi, unspecified: D22.9

## 2024-03-23 ENCOUNTER — Encounter: Payer: Self-pay | Admitting: Adult Health

## 2024-03-25 ENCOUNTER — Other Ambulatory Visit: Payer: Self-pay | Admitting: *Deleted

## 2024-03-25 DIAGNOSIS — G43719 Chronic migraine without aura, intractable, without status migrainosus: Secondary | ICD-10-CM

## 2024-03-25 MED ORDER — UBRELVY 100 MG PO TABS: 100.0000 mg | ORAL_TABLET | ORAL | 7 refills | Status: AC | PRN

## 2024-03-26 ENCOUNTER — Other Ambulatory Visit: Payer: Self-pay

## 2024-03-26 MED ORDER — ZOLMITRIPTAN 5 MG PO TABS: 5.0000 mg | ORAL_TABLET | ORAL | 6 refills | Status: AC | PRN

## 2024-05-01 ENCOUNTER — Encounter: Payer: Self-pay | Admitting: Family Medicine

## 2024-05-02 ENCOUNTER — Telehealth: Payer: Self-pay

## 2024-05-02 NOTE — Telephone Encounter (Signed)
 Pharmacy Patient Advocate Encounter   Received notification from Fax that prior authorization for Qulipta  is due for renewal.   Insurance verification completed.   The patient is insured through [employer].  Action: Medication has been discontinued. Archived Key: [id number]

## 2024-05-04 ENCOUNTER — Encounter (HOSPITAL_BASED_OUTPATIENT_CLINIC_OR_DEPARTMENT_OTHER): Payer: Self-pay | Admitting: Emergency Medicine

## 2024-05-04 ENCOUNTER — Emergency Department (HOSPITAL_BASED_OUTPATIENT_CLINIC_OR_DEPARTMENT_OTHER)
Admission: EM | Admit: 2024-05-04 | Discharge: 2024-05-05 | Disposition: A | Discharge status: Home / Self Care | Attending: Emergency Medicine | Admitting: Emergency Medicine

## 2024-05-04 ENCOUNTER — Other Ambulatory Visit: Payer: Self-pay

## 2024-05-04 DIAGNOSIS — R1030 Lower abdominal pain, unspecified: Secondary | ICD-10-CM | POA: Diagnosis present

## 2024-05-04 LAB — CBC WITH DIFFERENTIAL/PLATELET
Abs Immature Granulocytes: 0.02 10*3/uL (ref 0.00–0.07)
Basophils Absolute: 0 10*3/uL (ref 0.0–0.1)
Basophils Relative: 0 %
Eosinophils Absolute: 0.1 10*3/uL (ref 0.0–0.5)
Eosinophils Relative: 1 %
HCT: 41.7 % (ref 36.0–46.0)
Hemoglobin: 14.1 g/dL (ref 12.0–15.0)
Immature Granulocytes: 0 %
Lymphocytes Relative: 34 %
Lymphs Abs: 3.1 10*3/uL (ref 0.7–4.0)
MCH: 29.3 pg (ref 26.0–34.0)
MCHC: 33.8 g/dL (ref 30.0–36.0)
MCV: 86.5 fL (ref 80.0–100.0)
Monocytes Absolute: 0.6 10*3/uL (ref 0.1–1.0)
Monocytes Relative: 7 %
Neutro Abs: 5.1 10*3/uL (ref 1.7–7.7)
Neutrophils Relative %: 58 %
Platelets: 307 10*3/uL (ref 150–400)
RBC: 4.82 MIL/uL (ref 3.87–5.11)
RDW: 13.4 % (ref 11.5–15.5)
WBC: 8.9 10*3/uL (ref 4.0–10.5)
nRBC: 0 % (ref 0.0–0.2)

## 2024-05-04 LAB — URINALYSIS, MICROSCOPIC (REFLEX)

## 2024-05-04 LAB — COMPREHENSIVE METABOLIC PANEL WITH GFR
ALT: 32 U/L (ref 0–44)
AST: 29 U/L (ref 15–41)
Albumin: 4.4 g/dL (ref 3.5–5.0)
Alkaline Phosphatase: 80 U/L (ref 38–126)
Anion gap: 12 (ref 5–15)
BUN: 9 mg/dL (ref 6–20)
CO2: 22 mmol/L (ref 22–32)
Calcium: 9 mg/dL (ref 8.9–10.3)
Chloride: 105 mmol/L (ref 98–111)
Creatinine, Ser: 0.8 mg/dL (ref 0.44–1.00)
GFR, Estimated: >60 mL/min (ref >60)
Glucose, Bld: 99 mg/dL (ref 70–99)
Potassium: 4 mmol/L (ref 3.5–5.1)
Sodium: 138 mmol/L (ref 135–145)
Total Bilirubin: 0.2 mg/dL (ref 0.0–1.2)
Total Protein: 7.5 g/dL (ref 6.5–8.1)

## 2024-05-04 LAB — LIPASE, BLOOD: Lipase: 18 U/L (ref 11–51)

## 2024-05-04 LAB — URINALYSIS, ROUTINE W REFLEX MICROSCOPIC
Bilirubin Urine: NEGATIVE
Glucose, UA: NEGATIVE mg/dL
Hgb urine dipstick: NEGATIVE
Ketones, ur: NEGATIVE mg/dL
Leukocytes,Ua: NEGATIVE
Nitrite: NEGATIVE
Protein, ur: 30 mg/dL — AB
Specific Gravity, Urine: 1.02 (ref 1.005–1.030)
pH: 7.5 (ref 5.0–8.0)

## 2024-05-04 LAB — PREGNANCY, URINE: Preg Test, Ur: NEGATIVE

## 2024-05-04 MED ORDER — ACETAMINOPHEN 500 MG PO TABS
1000.0000 mg | ORAL_TABLET | Freq: Once | ORAL | Status: AC
  Administered 2024-05-04: 1000 mg via ORAL
  Filled 2024-05-04: qty 2

## 2024-05-04 NOTE — ED Notes (Signed)
 ED Provider at bedside.

## 2024-05-04 NOTE — ED Triage Notes (Signed)
 Pt c/o generalized lower abd pain since 1700; +nausea; denies urinary sxs

## 2024-05-05 ENCOUNTER — Emergency Department (HOSPITAL_BASED_OUTPATIENT_CLINIC_OR_DEPARTMENT_OTHER)

## 2024-05-05 MED ORDER — HYDROCODONE-ACETAMINOPHEN 5-325 MG PO TABS: 1.0000 | ORAL_TABLET | Freq: Four times a day (QID) | ORAL | 0 refills | Status: DC | PRN

## 2024-05-05 NOTE — ED Provider Notes (Signed)
 [place] [facility] AT [facility] Provider Note   CSN: [id number] Arrival date & time: 05/04/24  2043     History  Chief Complaint  Patient presents with   Abdominal Pain    Bailey Schwartz is a 38 y.o. female.  Patient is a 38 year old female with history of ovarian cysts.  Patient presenting today with complaints of lower abdominal pain.  This started earlier today while she was at a gender reveal party.  She denies any vomiting or diarrhea.  No fevers or chills.  She describes a constant pain to the suprapubic region that is worse with movement and palpation.       Home Medications Prior to Admission medications   Medication Sig Start Date End Date Taking? Authorizing Provider  amitriptyline  (ELAVIL ) 50 MG tablet Take 1 tablet (50 mg total) by mouth at bedtime. 11/09/23   [name], NP  JUNEL  FE 1/20 1-20 MG-MCG tablet TAKE 1 TABLET BY MOUTH EVERY DAY 05/09/23   Mickiel [name], FNP  methylPREDNISolone  (MEDROL  DOSEPAK) 4 MG TBPK tablet Taper pack as directed Patient not taking: Reported on 02/02/2024 01/17/24   [name], NP  triamcinolone  cream (KENALOG ) 0.1 % Apply 1 Application topically 2 (two) times daily. 02/02/24   Mickiel [name], FNP  Ubrogepant  (UBRELVY ) 100 MG TABS Take 1 tablet (100 mg total) by mouth as needed (for migraine). May repeat a dose in 2 hours if needed 03/25/24   [name], NP  zolmitriptan  (ZOMIG ) 5 MG tablet Take 1 tablet (5 mg total) by mouth as needed for migraine. 03/26/24   [name], NP      Allergies    Sulfamethoxazole-trimethoprim and Fluconazole    Review of Systems   Review of Systems  All other systems reviewed and are negative.   Physical Exam Updated Vital Signs BP 115/72 (BP Location: Right Arm)   Pulse 80   Temp 98 F (36.7 C) (Oral)   Resp 17   Ht 5\' 3"  (1.6 m)   Wt 113.4 kg   SpO2 99%   BMI 44.29 kg/m  Physical Exam Vitals and nursing note reviewed.  Constitutional:      General:  She is not in acute distress.    Appearance: She is well-developed. She is not diaphoretic.  HENT:     Head: Normocephalic and atraumatic.  Cardiovascular:     Rate and Rhythm: Normal rate and regular rhythm.     Heart sounds: No murmur heard.    No friction rub. No gallop.  Pulmonary:     Effort: Pulmonary effort is normal. No respiratory distress.     Breath sounds: Normal breath sounds. No wheezing.  Abdominal:     General: Bowel sounds are normal. There is no distension.     Palpations: Abdomen is soft.     Tenderness: There is abdominal tenderness in the suprapubic area. There is no right CVA tenderness, left CVA tenderness, guarding or rebound.  Musculoskeletal:        General: Normal range of motion.     Cervical back: Normal range of motion and neck supple.  Skin:    General: Skin is warm and dry.  Neurological:     General: No focal deficit present.     Mental Status: She is alert and oriented to person, place, and time.     ED Results / Procedures / Treatments   Labs (all labs ordered are listed, but only abnormal results are displayed) Labs Reviewed  URINALYSIS,  ROUTINE W REFLEX MICROSCOPIC - Abnormal; Notable for the following components:      Result Value   Protein, ur 30 (*)    All other components within normal limits  URINALYSIS, MICROSCOPIC (REFLEX) - Abnormal; Notable for the following components:   Bacteria, UA RARE (*)    Non Squamous Epithelial PRESENT (*)    All other components within normal limits  LIPASE, BLOOD  COMPREHENSIVE METABOLIC PANEL WITH GFR  PREGNANCY, URINE  CBC WITH DIFFERENTIAL/PLATELET    EKG None  Radiology CT Renal Stone Study Result Date: 05/05/2024 CLINICAL DATA:  Flank pain with nausea EXAM: CT ABDOMEN AND PELVIS WITHOUT CONTRAST TECHNIQUE: Multidetector CT imaging of the abdomen and pelvis was performed following the standard protocol without IV contrast. RADIATION DOSE REDUCTION: This exam was performed according to the  departmental dose-optimization program which includes automated exposure control, adjustment of the mA and/or kV according to patient size and/or use of iterative reconstruction technique. COMPARISON:  None Available. FINDINGS: Lower Chest: Normal. Hepatobiliary: Normal hepatic contours. No intra- or extrahepatic biliary dilatation. The gallbladder is normal. Pancreas: Normal pancreas. No ductal dilatation or peripancreatic fluid collection. Spleen: Normal. Adrenals/Urinary Tract: The adrenal glands are normal. There is a 3 mm nonobstructing interpolar left renal calculus. No hydronephrosis. No ureteral calculi. The urinary bladder is normal for degree of distention Stomach/Bowel: There is no hiatal hernia. Normal duodenal course and caliber. No small bowel dilatation or inflammation. No focal colonic abnormality. Normal appendix. Vascular/Lymphatic: Normal course and caliber of the major abdominal vessels. No abdominal or pelvic lymphadenopathy. Reproductive: Normal prostate size with symmetric seminal vesicles. Other: None. Musculoskeletal: No bony spinal canal stenosis or focal osseous abnormality. IMPRESSION: 1. No acute abnormality of the abdomen or pelvis. 2. Nonobstructing 3 mm interpolar left renal calculus. Electronically Signed   By: Juanetta Nordmann M.D.   On: 05/05/2024 01:26    Procedures Procedures    Medications Ordered in ED Medications  acetaminophen  (TYLENOL ) tablet 1,000 mg (1,000 mg Oral Given 05/04/24 2323)    ED Course/ Medical Decision Making/ A&P  Patient is a 38 year old female presenting with lower abdominal pain as described in the HPI.  Patient arrives here with stable vital signs and is afebrile.  Physical examination reveals suprapubic tenderness, but no other significant abnormal findings.  Laboratory studies obtained including CBC, CMP, and lipase, all of which are unremarkable.  Urinalysis is clear and pregnancy test negative.  CT with renal protocol obtained showing no  acute intra-abdominal process.  Patient was given acetaminophen  at her request with some improvement.  At this point, cause of the abdominal pain unclear, but nothing appears emergent.  There is no evidence for appendicitis, bowel obstruction, or other abnormality.  I have also considered ovarian torsion, but presentation and exam inconsistent and I highly doubt this possibility.  I feel as though patient can safely be discharged with ibuprofen , small quantity of pain medication, and return as needed.  Final Clinical Impression(s) / ED Diagnoses Final diagnoses:  None    Rx / DC Orders ED Discharge Orders     None         Orvilla Blander, MD 05/05/24 [phone]

## 2024-05-05 NOTE — Discharge Instructions (Addendum)
 Begin taking ibuprofen  as prescribed.  Begin taking hydrocodone as prescribed as needed for pain not relieved with ibuprofen .  Return to the emergency department for worsening pain, high fever, bloody stools, or for other new and concerning symptoms.

## 2024-05-05 NOTE — ED Notes (Signed)
 ED Provider at bedside. Patient provided updates.

## 2024-05-05 NOTE — ED Notes (Signed)
 Patient transported to CT

## 2024-05-05 NOTE — ED Notes (Signed)
 Patient returned from CT

## 2024-05-07 ENCOUNTER — Encounter: Payer: Self-pay | Admitting: Family Medicine

## 2024-05-07 ENCOUNTER — Ambulatory Visit: Admitting: Family Medicine

## 2024-05-07 VITALS — BP 113/82 | HR 109 | Temp 98.4°F | Resp 18 | Ht 63.5 in | Wt 248.5 lb

## 2024-05-07 DIAGNOSIS — M549 Dorsalgia, unspecified: Secondary | ICD-10-CM | POA: Insufficient documentation

## 2024-05-07 HISTORY — DX: Dorsalgia, unspecified: M54.9

## 2024-05-07 MED ORDER — NAPROXEN 500 MG PO TABS
500.0000 mg | ORAL_TABLET | Freq: Two times a day (BID) | ORAL | 0 refills | Status: AC
Start: 2024-05-07 — End: ?

## 2024-05-07 NOTE — Assessment & Plan Note (Addendum)
 Intermittent pain and tightness in mid-low back. Present for years. Normal back exam in the office today. No red flag back symptoms.  No bony tenderness. No indication for x-ray. Recommend continued weight loss: has lost 7 pounds so far. Strength training for upper body/back strength.  Massage therapy as needed. Heating pad for 20 minutes at time. Naproxen  500 mg up to two times per day with food.  No indication for ortho referral today. If symptoms do not improve with strengthening and weight loss, she will notify provider.

## 2024-05-07 NOTE — Patient Instructions (Signed)
 Strengthen the back  muscles. Use heating pad for pain. Get massage as needed.  Naproxen  with food up to twice per day.

## 2024-05-07 NOTE — Progress Notes (Signed)
   Established Patient Office Visit  Subjective   Patient ID: Bailey Schwartz, female    DOB: 1986/11/09  Age: 38 y.o. MRN: [id number]  Chief Complaint  Patient presents with   Back Pain    X5 yrs noticed worsening in last wk and a half   forms    Back pain for years. Low back to mid. Feels tight. No numbness or tingling. No changes in bowel and bladder function. No injury. Massage therapy helps for one month. Symptoms come and go. Has lost 7 pounds. Exercising more.  Has not taken ibuprofen .        ROS    Objective:     BP 113/82 (BP Location: Left Arm, Patient Position: Sitting, Cuff Size: Large)   Pulse (!) 109   Temp 98.4 F (36.9 C) (Oral)   Resp 18   Ht 5' 3.5" (1.613 m)   Wt 248 lb 8 oz (112.7 kg)   SpO2 96%   BMI 43.33 kg/m  BP Readings from Last 3 Encounters:  05/07/24 113/82  05/05/24 111/71  02/02/24 115/88      Physical Exam Constitutional:      Appearance: Normal appearance. She is obese.  Cardiovascular:     Rate and Rhythm: Regular rhythm.     Heart sounds: Normal heart sounds.  Pulmonary:     Effort: Pulmonary effort is normal.     Breath sounds: Normal breath sounds.  Musculoskeletal:     Comments: Normal gait. Normal coordination. ROM normal. 5/5 upper and lower extremity strength. Negative straight leg raise. No bony spinous process tenderness. No loss of sensation.    Skin:    General: Skin is warm and dry.  Neurological:     General: No focal deficit present.     Mental Status: She is alert. Mental status is at baseline.  Psychiatric:        Mood and Affect: Mood normal.        Behavior: Behavior normal.        Thought Content: Thought content normal.        Judgment: Judgment normal.      No results found for any visits on 05/07/24.    The ASCVD Risk score (Arnett DK, et al., 2019) failed to calculate for the following reasons:   The 2019 ASCVD risk score is only valid for ages 24 to 63    Assessment &  Plan:   Problem List Items Addressed This Visit     Musculoskeletal back pain - Primary   Intermittent pain and tightness in mid-low back. Present for years. Normal back exam in the office today. No red flag back symptoms.  No bony tenderness. No indication for x-ray. Recommend continued weight loss: has lost 7 pounds so far. Strength training for upper body/back strength.  Massage therapy as needed. Heating pad for 20 minutes at time. Naproxen  500 mg up to two times per day with food.  No indication for ortho referral today. If symptoms do not improve with strengthening and weight loss, she will notify provider.       Relevant Medications   naproxen  (NAPROSYN ) 500 MG tablet  Agrees with plan of care discussed.  Questions answered.   Return if symptoms worsen or fail to improve.    Mickiel [name], FNP

## 2024-05-08 NOTE — Progress Notes (Signed)
 Referring:  Mickiel [name], FNP [address] [place],  [place] [address]  PCP: Mickiel [name], FNP   CC:  headaches  History provided from self  Virtual Visit via Video Note  I connected with Bailey Schwartz on 05/09/24 at  2:15 PM EDT by a video enabled telemedicine application and verified that I am speaking with the correct person using two identifiers.  Location: Patient: at home Provider: in office, [facility]   I discussed the limitations of evaluation and management by telemedicine and the availability of in person appointments. The patient expressed understanding and agreed to proceed.   Follow-up visit:  Prior visit: 11/09/2023  Brief HPI:   Bailey Schwartz is a 38 y.o. female who is being followed for chronic migraine headaches which began over 10 years ago.  At initial visit with Dr. Billy Bue in 01/2023, she reported about 20 migraine days per month associated with photophobia and phonophobia.  CTA head/neck showed partially empty sella but otherwise unremarkable.    At prior visit, amitriptyline  dosage increased to 50 mg nightly, remained on Ubrelvy  for rescue, referred to Dr. Candi Chafe to evaluate for papilledema  During the interim time, patient sent MyChart message back in February reporting prolonged migraine.  She was provided steroid taper pack and Toradol  shot by PCP    Interval history:  Returns today for follow-up visit via MyChart video visit.  Reports stopping birth control in April and since then migraines have significantly improved.  She typically has about 2-3 migraines per month, more frequent around her menstrual cycle.  More recent migraines less severe and do not last long.  Use of Ubrelvy  with benefit, occasionally requires second dose.  She still has Zomig  as needed but has only taken 1 since the prior visit.  She no longer wakes with migraine headache.  Does report benefit after steroid taper pack to break migraine cycle.  She was seen by Dr. Candi Chafe and  per patient report, no evidence of papilledema.      Headache History: Onset: over 10 years ago Aura: no Location: unilateral, either side Associated Symptoms:  Photophobia: yes  Phonophobia: yes  Nausea: no Worse with activity?: yes Duration of headaches: 2-5 days  Migraine days per month: 2-3   Current Treatment: Abortive Ubrelvy  100mg  PRN  Preventative Amitriptyline  25 mg nightly  Prior Therapies                                 Rescue: Rizatriptan  5 mg PRN - lack of efficacy Eletriptan  20 mg PRN - lack of efficacy Sumatriptan  100 mg PRN - lack of efficacy Zomig  - lack of efficacy Ubrelvy   Prevention: Propranolol  80 mg daily Cymbalta  40 mg daily Qulipta  -intolerant Amitriptyline   LABS: CBC    Component Value Date/Time   WBC 8.9 05/04/2024 2053   RBC 4.82 05/04/2024 2053   HGB 14.1 05/04/2024 2053   HGB 14.2 02/02/2024 0840   HCT 41.7 05/04/2024 2053   HCT 43.5 02/02/2024 0840   PLT 307 05/04/2024 2053   PLT 318 02/02/2024 0840   MCV 86.5 05/04/2024 2053   MCV 89 02/02/2024 0840   MCH 29.3 05/04/2024 2053   MCHC 33.8 05/04/2024 2053   RDW 13.4 05/04/2024 2053   RDW 12.7 02/02/2024 0840   LYMPHSABS 3.1 05/04/2024 2053   LYMPHSABS 2.8 02/02/2024 0840   MONOABS 0.6 05/04/2024 2053   EOSABS 0.1 05/04/2024 2053   EOSABS 0.1 02/02/2024  0840   BASOSABS 0.0 05/04/2024 2053   BASOSABS 0.1 02/02/2024 0840      Latest Ref Rng & Units 05/04/2024    8:53 PM 02/02/2024    8:40 AM 01/11/2023    8:50 AM  CMP  Glucose 70 - 99 mg/dL 99  91  82   BUN 6 - 20 mg/dL 9  9  10    Creatinine 0.44 - 1.00 mg/dL 1.61  0.96  0.45   Sodium 135 - 145 mmol/L 138  139  137   Potassium 3.5 - 5.1 mmol/L 4.0  4.5  4.4   Chloride 98 - 111 mmol/L 105  104  103   CO2 22 - 32 mmol/L 22  21  20    Calcium 8.9 - 10.3 mg/dL 9.0  9.2  8.8   Total Protein 6.5 - 8.1 g/dL 7.5  6.8  6.7   Total Bilirubin 0.0 - 1.2 mg/dL 0.2  0.3  <4.0   Alkaline Phos 38 - 126 U/L 80  84  106   AST 15 -  41 U/L 29  18  14    ALT 0 - 44 U/L 32  17  11      IMAGING:  CTH 09/16/21: unremarkable  Imaging independently reviewed on May 08, 2024   Current Outpatient Medications on File Prior to Visit  Medication Sig Dispense Refill   amitriptyline  (ELAVIL ) 50 MG tablet Take 1 tablet (50 mg total) by mouth at bedtime. 30 tablet 5   HYDROcodone-acetaminophen  (NORCO/VICODIN) 5-325 MG tablet Take 1-2 tablets by mouth every 6 (six) hours as needed. 10 tablet 0   JUNEL  FE 1/20 1-20 MG-MCG tablet TAKE 1 TABLET BY MOUTH EVERY DAY 28 tablet 0   methylPREDNISolone  (MEDROL  DOSEPAK) 4 MG TBPK tablet Taper pack as directed (Patient not taking: Reported on 05/07/2024) 1 each 0   naproxen  (NAPROSYN ) 500 MG tablet Take 1 tablet (500 mg total) by mouth 2 (two) times daily with a meal. 30 tablet 0   triamcinolone  cream (KENALOG ) 0.1 % Apply 1 Application topically 2 (two) times daily. 30 g 0   Ubrogepant  (UBRELVY ) 100 MG TABS Take 1 tablet (100 mg total) by mouth as needed (for migraine). May repeat a dose in 2 hours if needed 16 tablet 7   zolmitriptan  (ZOMIG ) 5 MG tablet Take 1 tablet (5 mg total) by mouth as needed for migraine. 10 tablet 6   No current facility-administered medications on file prior to visit.     Allergies: Allergies  Allergen Reactions   Sulfamethoxazole-Trimethoprim Hives, Swelling and Rash    Angioedema     Fluconazole Hives, Swelling and Rash    Family History: Migraine or other headaches in the family:  aunts, paternal grandfather have migraines Aneurysms in a first degree relative:  father and paternal grandfather had aneurysms Brain tumors in the family:  no Other neurological illness in the family:   no  Past Medical History: Past Medical History:  Diagnosis Date   Depression    Genital warts    Gestational diabetes    History of abnormal cervical Pap smear    Hx of HPV   History of chicken pox    Migraine    Preeclampsia    Vaginal Pap smear, abnormal      Past Surgical History Past Surgical History:  Procedure Laterality Date   CESAREAN SECTION N/A 09/18/2022   Procedure: CESAREAN SECTION;  Surgeon: Ivery Marking, MD;  Location: [facility] ORS;  Service: Obstetrics;  Laterality: N/A;  REFRACTIVE SURGERY      Social History: Social History   Tobacco Use   Smoking status: Former    Current packs/day: 0.18    Types: Cigarettes   Smokeless tobacco: Never  Vaping Use   Vaping status: Never Used  Substance Use Topics   Alcohol use: Not Currently   Drug use: Not Currently    ROS: Negative for fevers, chills. Positive for headaches. All other systems reviewed and negative unless stated otherwise in HPI.   Physical Exam:   GENERAL: well appearing,in no acute distress,alert  NEUROLOGICAL: Mental Status: Alert, oriented to person, place and time,Follows commands       IMPRESSION: 38 year old female with a history of depression who returns for follow-up of chronic migraines.  CTA head 03/2023 showed partially empty sella, no evidence of aneurysms or high-grade stenosis.  No evidence of papilledema on ophthalmology exam.  Reports significant improvement of migraine headaches since discontinuing birth control back in April as well as ongoing use of amitriptyline , currently experiencing 2 to 3/month, use of Ubrelvy  with benefit.     PLAN: -Prevention: Continue amitriptyline  to 50 mg nightly -Rescue: Continue Ubrelvy  100 mg PRN, continue Zomig  as needed.  She is aware not to use together.     Follow-up in 8-9 months via MyChart video visit or call earlier if needed      I personally spent a total of 15 minutes in the care of the patient today including preparing to see the patient, performing a medically appropriate exam/evaluation, placing orders, and documenting clinical information in the EHR.  Bailey Schwartz, AGNP-BC  [facility] [address] [place], [place] [address]  Phone  [phone] Fax [phone] Note: This document was prepared with digital dictation and possible smart phrase technology. Any transcriptional errors that result from this process are unintentional.

## 2024-05-09 ENCOUNTER — Telehealth: Payer: BC Managed Care – PPO | Admitting: Adult Health

## 2024-05-09 ENCOUNTER — Encounter: Payer: Self-pay | Admitting: Adult Health

## 2024-05-09 DIAGNOSIS — G43709 Chronic migraine without aura, not intractable, without status migrainosus: Secondary | ICD-10-CM | POA: Diagnosis not present

## 2024-05-09 DIAGNOSIS — G43009 Migraine without aura, not intractable, without status migrainosus: Secondary | ICD-10-CM

## 2024-05-09 MED ORDER — AMITRIPTYLINE HCL 50 MG PO TABS: 50.0000 mg | ORAL_TABLET | Freq: Every day | ORAL | 3 refills | Status: AC

## 2024-05-09 NOTE — Patient Instructions (Addendum)
 Your Plan:  Continue amitriptyline  50 mg nightly  Continue Ubrelvy  as needed, continue Zomig  as needed, do not use together     Follow-up in 8 to 9 months or call earlier if needed     Thank you for coming to see us  at [facility]. I hope we have been able to provide you high quality care today.  You may receive a patient satisfaction survey over the next few weeks. We would appreciate your feedback and comments so that we may continue to improve ourselves and the health of our patients.

## 2024-05-09 NOTE — Addendum Note (Signed)
 Addended by: [name] L on: 05/09/2024 02:26 PM   Modules accepted: Level of Service

## 2024-05-15 ENCOUNTER — Other Ambulatory Visit: Payer: Self-pay | Admitting: Family Medicine

## 2024-05-15 ENCOUNTER — Encounter: Payer: Self-pay | Admitting: Family Medicine

## 2024-05-15 DIAGNOSIS — R000 Tachycardia, unspecified: Secondary | ICD-10-CM

## 2024-05-15 HISTORY — DX: Tachycardia, unspecified: R00.0

## 2024-06-03 ENCOUNTER — Other Ambulatory Visit (HOSPITAL_COMMUNITY): Payer: Self-pay

## 2024-06-04 ENCOUNTER — Ambulatory Visit

## 2024-06-07 ENCOUNTER — Other Ambulatory Visit (HOSPITAL_COMMUNITY): Payer: Self-pay

## 2024-07-04 ENCOUNTER — Encounter: Payer: Self-pay | Admitting: Family Medicine

## 2024-07-09 ENCOUNTER — Ambulatory Visit: Admitting: Family Medicine

## 2024-07-09 VITALS — BP 94/66 | HR 100 | Temp 98.0°F | Ht 63.0 in | Wt 238.0 lb

## 2024-07-09 DIAGNOSIS — M549 Dorsalgia, unspecified: Secondary | ICD-10-CM | POA: Diagnosis not present

## 2024-07-09 NOTE — Assessment & Plan Note (Signed)
 Low back pain that has resolved. Present for 4 days. No red flags. Normal back exam today. Reports increasing her workouts which could be related to recent back pain. Follow-up as needed.

## 2024-07-09 NOTE — Progress Notes (Signed)
   Acute Office Visit  Subjective:     Patient ID: Bailey Schwartz, female    DOB: 05-24-1986, 38 y.o.   MRN: [id number]  Chief Complaint  Patient presents with   Back Pain    Lower back pain gone now but lasted about 3-4 days    HPI Patient is in today for low back pain that has resolved. No trauma. Working out more with cardio and strength training. No saddle paresthesia. No changes in bowel and bladder. Naproxen  helped. Massage and rest.  No weakness.  No rash.   ROS      Objective:    BP 94/66 (BP Location: Left Arm, Patient Position: Sitting, Cuff Size: Large)   Pulse 100   Temp 98 F (36.7 C) (Oral)   Ht 5' 3 (1.6 m)   Wt 238 lb (108 kg)   LMP 06/14/2024 (Exact Date)   SpO2 99%   BMI 42.16 kg/m    Physical Exam Vitals and nursing note reviewed.  Constitutional:      General: She is not in acute distress.    Appearance: Normal appearance.  Cardiovascular:     Rate and Rhythm: Regular rhythm.     Heart sounds: Normal heart sounds.  Pulmonary:     Effort: Pulmonary effort is normal.     Breath sounds: Normal breath sounds.  Musculoskeletal:     Comments: Normal gait. Normal coordination. ROM normal. 5/5 upper and lower extremity strength. Negative straight leg raise. No bony spinous process tenderness. No loss of sensation. No rash.    Skin:    General: Skin is warm and dry.  Neurological:     General: No focal deficit present.     Mental Status: She is alert. Mental status is at baseline.  Psychiatric:        Mood and Affect: Mood normal.        Behavior: Behavior normal.        Thought Content: Thought content normal.        Judgment: Judgment normal.     No results found for any visits on 07/09/24.      Assessment & Plan:   Problem List Items Addressed This Visit     Acute midline back pain - Primary   Low back pain that has resolved. Present for 4 days. No red flags. Normal back exam today. Reports increasing her workouts  which could be related to recent back pain. Follow-up as needed.      Agrees with plan of care discussed.  Questions answered.      Return if symptoms worsen or fail to improve.  Darice JONELLE Brownie, FNP

## 2024-07-10 ENCOUNTER — Ambulatory Visit: Admitting: Family Medicine

## 2024-07-26 ENCOUNTER — Telehealth: Payer: Self-pay

## 2024-07-26 ENCOUNTER — Other Ambulatory Visit (HOSPITAL_COMMUNITY): Payer: Self-pay

## 2024-07-26 NOTE — Telephone Encounter (Signed)
 Pharmacy Patient Advocate Encounter   Received notification from Fax that prior authorization for Ubrelvy  is required/requested.   Insurance verification completed.   The patient is insured through [employer] .   Per test claim: PA required; PA submitted to above mentioned insurance via Latent Key/confirmation #/EOC [id number] Status is pending

## 2024-08-02 ENCOUNTER — Other Ambulatory Visit (HOSPITAL_COMMUNITY): Payer: Self-pay

## 2024-08-02 NOTE — Telephone Encounter (Signed)
 I called [facility] [phone]) to follow up on PA-PA is still under review.  Case ID: [id number]

## 2024-08-04 ENCOUNTER — Encounter: Payer: Self-pay | Admitting: Family Medicine

## 2024-08-04 ENCOUNTER — Other Ambulatory Visit: Payer: Self-pay

## 2024-08-04 ENCOUNTER — Encounter (HOSPITAL_COMMUNITY): Payer: Self-pay | Admitting: Emergency Medicine

## 2024-08-04 ENCOUNTER — Emergency Department (HOSPITAL_COMMUNITY)
Admission: EM | Admit: 2024-08-04 | Discharge: 2024-08-04 | Disposition: A | Discharge status: Home / Self Care | Attending: Emergency Medicine | Admitting: Emergency Medicine

## 2024-08-04 DIAGNOSIS — K644 Residual hemorrhoidal skin tags: Secondary | ICD-10-CM | POA: Diagnosis not present

## 2024-08-04 DIAGNOSIS — K649 Unspecified hemorrhoids: Secondary | ICD-10-CM

## 2024-08-04 DIAGNOSIS — K625 Hemorrhage of anus and rectum: Secondary | ICD-10-CM | POA: Diagnosis present

## 2024-08-04 MED ORDER — LIDOCAINE-EPINEPHRINE-TETRACAINE (LET) TOPICAL GEL
3.0000 mL | Freq: Once | TOPICAL | Status: AC
  Administered 2024-08-04: 3 mL via TOPICAL
  Filled 2024-08-04: qty 3

## 2024-08-04 NOTE — ED Provider Notes (Signed)
 [place] EMERGENCY DEPARTMENT AT [facility] Provider Note   CSN: [id number] Arrival date & time: 08/04/24  9180     Patient presents with: No chief complaint on file.   Bailey Schwartz is a 38 y.o. female with PMHx depression, migraines who presents to the ED concerned for BRBPR. Patient noticed the bright red blood in the toilet after a BM this morning. Patient stating that the stool otherwise looked normal light-brown. Patient endorses hx of hemorrhoids and states that she was straining from constipation last week which has since felt better since she started taking Miralax. Patient stating that she went to use the bathroom again later this morning and noticed that there was no more bleeding or blood on the wipe. Patient denies fever, nausea, vomiting, diarrhea, abdominal pain. Denies hx of IBS/Crohns.   HPI     Prior to Admission medications   Medication Sig Start Date End Date Taking? Authorizing Provider  amitriptyline  (ELAVIL ) 50 MG tablet Take 1 tablet (50 mg total) by mouth at bedtime. 05/09/24   Whitfield Raisin, NP  naproxen  (NAPROSYN ) 500 MG tablet Take 1 tablet (500 mg total) by mouth 2 (two) times daily with a meal. 05/07/24   Booker Darice SAUNDERS, FNP  triamcinolone  cream (KENALOG ) 0.1 % Apply 1 Application topically 2 (two) times daily. 02/02/24   Booker Darice SAUNDERS, FNP  Ubrogepant  (UBRELVY ) 100 MG TABS Take 1 tablet (100 mg total) by mouth as needed (for migraine). May repeat a dose in 2 hours if needed 03/25/24   Whitfield Raisin, NP  zolmitriptan  (ZOMIG ) 5 MG tablet Take 1 tablet (5 mg total) by mouth as needed for migraine. 03/26/24   Whitfield Raisin, NP    Allergies: Sulfamethoxazole-trimethoprim and Fluconazole    Review of Systems  Genitourinary:        Rectal bleeding    Updated Vital Signs BP 124/89   Pulse 94   Temp 98.3 F (36.8 C) (Oral)   Resp 16   Ht 5' 3 (1.6 m)   Wt 106.6 kg   LMP 06/14/2024 (Exact Date)   SpO2 100%   BMI 41.63 kg/m   Physical  Exam Vitals and nursing note reviewed.  Constitutional:      General: She is not in acute distress.    Appearance: She is not ill-appearing or toxic-appearing.  HENT:     Head: Normocephalic and atraumatic.  Eyes:     General: No scleral icterus.       Right eye: No discharge.        Left eye: No discharge.     Conjunctiva/sclera: Conjunctivae normal.  Cardiovascular:     Rate and Rhythm: Normal rate and regular rhythm.     Heart sounds: Normal heart sounds.  Pulmonary:     Effort: Pulmonary effort is normal.     Breath sounds: Normal breath sounds.  Abdominal:     General: Abdomen is flat. Bowel sounds are normal. There is no distension.     Palpations: Abdomen is soft. There is no mass.     Tenderness: There is no abdominal tenderness.  Genitourinary:    Comments: Chaperone by RN Carico External hemorrhoid in 6o'clock position. No signs of thrombosis. No active bleeding. Stool sample grossly normal/light-brown and non-bloody. Skin:    General: Skin is warm and dry.  Neurological:     General: No focal deficit present.     Mental Status: She is alert and oriented to person, place, and time. Mental status is at baseline.  Psychiatric:        Mood and Affect: Mood normal.        Behavior: Behavior normal.     (all labs ordered are listed, but only abnormal results are displayed) Labs Reviewed - No data to display  EKG: None  Radiology: No results found.   Procedures   Medications Ordered in the ED  lidocaine -EPINEPHrine -tetracaine  (LET) topical gel (3 mLs Topical Given 08/04/24 0905)                                    Medical Decision Making  This patient presents to the ED for concern of BRBPR, this involves an extensive number of treatment options, and is a complaint that carries with it a high risk of complications and morbidity.  The differential diagnosis includes chron's disease, IBS, lower GI bleeding/hemorrhage, hemorrhoid, thrombosed hemorrhoid, anal  fissure   Co morbidities that complicate the patient evaluation  Migraines, hemorrhoids   Additional history obtained:  Dr. Booker PCP   Problem List / ED Course / Critical interventions / Medication management  Patient presents to ED concerned for BRBPR which has since resolved since the episode this morning. Patient was constipated last week which has improved after taking Miralax. Patient denies melena, hx Chron's/IBD, abdominal pain, nausea, vomiting, diarrhea, blood thinners, anal pain, fever. Vitals are reassuring without tachycardia, fever, or hypotension. Denies dizziness, LOC or any other severe signs of symptomatic anemia. Patient stating that she has plenty of Miralax and hemorrhoid cream at home. Patient stating that she has close follow up with PCP and will also be following up with GI and she would like a colonoscopy. I offered patient basic labs to check for anemia which she refused stating that she feels fine overall and the symptoms have resolved and she will be able to follow up with PCP closely.  I have reviewed the patients home medicines and have made adjustments as needed. The patient has been appropriately medically screened and/or stabilized in the ED. I have low suspicion for any other emergent medical condition which would require further screening, evaluation or treatment in the ED or require inpatient management. At time of discharge the patient is hemodynamically stable and in no acute distress. I have discussed work-up results and diagnosis with patient and answered all questions. Patient is agreeable with discharge plan. We discussed strict return precautions for returning to the emergency department and they verbalized understanding.    DDx: these are considered less likely d/t history of present illness and physical exam: -chron's disease/IBS: denies hx of conditions; no abdominal pain; symptoms are acute -lower GI bleeding/hemorrhage: DRE without obvious blood;  no abdominal pain -thrombosed hemorrhoid: physical exam reassuring   Social Determinants of Health:  none        Final diagnoses:  Hemorrhoids, unspecified hemorrhoid type    ED Discharge Orders     None          Hoy Nidia FALCON, [place] 08/04/24 9081    Jerrol Agent, MD 08/04/24 1433

## 2024-08-04 NOTE — ED Triage Notes (Incomplete)
 Pt had BM this AM and noticed bright red blood in the water  and on TP. No SOB or weakness endorsed.

## 2024-08-04 NOTE — Discharge Instructions (Addendum)
 As discussed, You will need to follow-up with your primary care provider.  Seek emergency care if experiencing any new or worsening symptoms.

## 2024-08-05 ENCOUNTER — Other Ambulatory Visit: Payer: Self-pay | Admitting: Family Medicine

## 2024-08-05 DIAGNOSIS — K649 Unspecified hemorrhoids: Secondary | ICD-10-CM | POA: Insufficient documentation

## 2024-08-05 HISTORY — DX: Unspecified hemorrhoids: K64.9

## 2024-08-07 ENCOUNTER — Ambulatory Visit

## 2024-08-09 ENCOUNTER — Other Ambulatory Visit (HOSPITAL_COMMUNITY): Payer: Self-pay

## 2024-08-09 NOTE — Telephone Encounter (Signed)
 Pharmacy Patient Advocate Encounter  Received notification from [facility] that Prior Authorization for Ubrelvy  100 mg tablets has been APPROVED from 08/07/2024 to 08/07/2025. Ran test claim, Copay is $40.00. This test claim was processed through [facility]- copay amounts may vary at other pharmacies due to pharmacy/plan contracts, or as the patient moves through the different stages of their insurance plan.

## 2024-08-12 ENCOUNTER — Encounter: Payer: Self-pay | Admitting: *Deleted

## 2024-08-12 NOTE — Telephone Encounter (Signed)
 Pt informed via mychart

## 2024-08-27 ENCOUNTER — Ambulatory Visit: Admitting: Cardiology

## 2024-08-28 ENCOUNTER — Encounter: Payer: Self-pay | Admitting: Adult Health

## 2024-09-03 MED ORDER — METHYLPREDNISOLONE 4 MG PO TBPK: ORAL_TABLET | ORAL | 0 refills | Status: DC

## 2024-09-18 ENCOUNTER — Ambulatory Visit: Admitting: Physician Assistant

## 2024-10-04 ENCOUNTER — Ambulatory Visit: Admitting: Gastroenterology

## 2024-10-04 ENCOUNTER — Encounter: Payer: Self-pay | Admitting: Gastroenterology

## 2024-10-04 VITALS — BP 112/80 | HR 102 | Ht 63.0 in | Wt 223.0 lb

## 2024-10-04 DIAGNOSIS — K5900 Constipation, unspecified: Secondary | ICD-10-CM

## 2024-10-04 DIAGNOSIS — R000 Tachycardia, unspecified: Secondary | ICD-10-CM | POA: Diagnosis not present

## 2024-10-04 DIAGNOSIS — Z83719 Family history of colon polyps, unspecified: Secondary | ICD-10-CM

## 2024-10-04 DIAGNOSIS — K625 Hemorrhage of anus and rectum: Secondary | ICD-10-CM | POA: Diagnosis not present

## 2024-10-04 NOTE — Progress Notes (Signed)
 Chief Complaint: rectal bleeding Primary GI MD: Dr. stacia  HPI: Discussed the use of AI scribe software for clinical note transcription with the patient, who gave verbal consent to proceed.  History of Present Illness  Bailey Schwartz is a 38 year old female who presents with rectal bleeding and hemorrhoids.  She has been experiencing significant rectal bleeding since September, occurring after bowel movements. This is the first time she has noticed such an amount of blood. She has a known history of hemorrhoids, which were identified during a visit to the emergency department where she was given a gel for treatment. The hemorrhoids tend to get irritated with almost every bowel movement, and she has been using hemorrhoid suppositories to alleviate the symptoms.  Her family history is concerning to her, as her mother had similar issues at a similar age and was found to have a precancerous polyp during a colonoscopy. Her father also had a polyp found, though details are less clear. This family history increases her concern about the rectal bleeding and the potential for colorectal issues.  Her bowel movements have changed since starting phentermine in the summer, leading to increased constipation. Previously, she had soft bowel movements three to four times a day. She has been using Miralax gummies to help soften her stools, which she finds effective, resulting in minimal pushing during bowel movements. She has been taking two gummies and notices improvement within a day or two.  She has also been working out more, including lifting weights, which she believes may have contributed to her constipation and subsequent bleeding. She recalls being constipated on the morning of the bleeding incident.   Past Medical History:  Diagnosis Date   Depression    Genital warts    Gestational diabetes    History of abnormal cervical Pap smear    Hx of HPV   History of chicken pox    Migraine     Preeclampsia    Vaginal Pap smear, abnormal     Past Surgical History:  Procedure Laterality Date   CESAREAN SECTION N/A 09/18/2022   Procedure: CESAREAN SECTION;  Surgeon: Rutherford Gain, MD;  Location: [facility];  Service: Obstetrics;  Laterality: N/A;   REFRACTIVE SURGERY      Current Outpatient Medications  Medication Sig Dispense Refill   amitriptyline  (ELAVIL ) 50 MG tablet Take 1 tablet (50 mg total) by mouth at bedtime. 90 tablet 3   methylPREDNISolone  (MEDROL  DOSEPAK) 4 MG TBPK tablet Taper pack as directed 1 each 0   naproxen  (NAPROSYN ) 500 MG tablet Take 1 tablet (500 mg total) by mouth 2 (two) times daily with a meal. 30 tablet 0   phentermine 15 MG capsule Take 15 mg by mouth daily.     topiramate  (TOPAMAX ) 25 MG tablet Take by mouth.     triamcinolone  cream (KENALOG ) 0.1 % Apply 1 Application topically 2 (two) times daily. 30 g 0   Ubrogepant  (UBRELVY ) 100 MG TABS Take 1 tablet (100 mg total) by mouth as needed (for migraine). May repeat a dose in 2 hours if needed 16 tablet 7   zolmitriptan  (ZOMIG ) 5 MG tablet Take 1 tablet (5 mg total) by mouth as needed for migraine. 10 tablet 6   No current facility-administered medications for this visit.    Allergies as of 10/04/2024 - Review Complete 10/04/2024  Allergen Reaction Noted   Sulfamethoxazole-trimethoprim Hives, Swelling, and Rash 08/26/2014   Fluconazole Hives, Swelling, and Rash 08/19/2014    Family History  Problem  Relation Age of Onset   Colon polyps Mother        unknown number   Miscarriages / Stillbirths Mother    Hypertension Mother    Diabetes Mother    Colon polyps Father        less than 10 lifetime polyps   Bone cancer Maternal Uncle        or other primary? mother's paternal half brother; d. 13-70s   Prostate cancer Maternal Uncle        dx 60s-70s   Esophageal cancer Maternal Grandmother        or lymphoma? d. 69s   Hyperlipidemia Maternal Grandmother    Hypertension Maternal  Grandmother    Aneurysm Maternal Grandfather    Hyperlipidemia Paternal Grandmother    Hypertension Paternal Grandmother    Hyperlipidemia Paternal Grandfather    Hypertension Paternal Grandfather    Stroke Paternal Grandfather     Social History   Socioeconomic History   Marital status: Married    Spouse name: Not on file   Number of children: 1   Years of education: Not on file   Highest education level: Bachelor's degree (e.g., BA, AB, BS)  Occupational History    Employer: [employer]  Tobacco Use   Smoking status: Former    Current packs/day: 0.18    Types: Cigarettes   Smokeless tobacco: Never  Vaping Use   Vaping status: Never Used  Substance and Sexual Activity   Alcohol use: Not Currently   Drug use: Not Currently   Sexual activity: Not on file  Other Topics Concern   Not on file  Social History Narrative   Not on file   Social Drivers of Health   Financial Resource Strain: Low Risk  (07/09/2024)   Overall Financial Resource Strain (CARDIA)    Difficulty of Paying Living Expenses: Not hard at all  Food Insecurity: No Food Insecurity (07/09/2024)   Hunger Vital Sign    Worried About Running Out of Food in the Last Year: Never true    Ran Out of Food in the Last Year: Never true  Transportation Needs: No Transportation Needs (07/09/2024)   PRAPARE - Administrator, Civil Service (Medical): No    Lack of Transportation (Non-Medical): No  Physical Activity: Sufficiently Active (07/09/2024)   Exercise Vital Sign    Days of Exercise per Week: 4 days    Minutes of Exercise per Session: 40 min  Stress: No Stress Concern Present (07/09/2024)   [employer] of Occupational Health - Occupational Stress Questionnaire    Feeling of Stress: Only a little  Social Connections: Moderately Isolated (07/09/2024)   Social Connection and Isolation Panel    Frequency of Communication with Friends and Family: More than three times a week     Frequency of Social Gatherings with Friends and Family: Three times a week    Attends Religious Services: Never    Active Member of Clubs or Organizations: No    Attends Banker Meetings: Not on file    Marital Status: Married  Catering Manager Violence: Not At Risk (04/27/2023)   Humiliation, Afraid, Rape, and Kick questionnaire    Fear of Current or Ex-Partner: No    Emotionally Abused: No    Physically Abused: No    Sexually Abused: No    Review of Systems:    Constitutional: No weight loss, fever, chills, weakness or fatigue HEENT: Eyes: No change in vision  Ears, Nose, Throat:  No change in hearing or congestion Skin: No rash or itching Cardiovascular: No chest pain, chest pressure or palpitations   Respiratory: No SOB or cough Gastrointestinal: See HPI and otherwise negative Genitourinary: No dysuria or change in urinary frequency Neurological: No headache, dizziness or syncope Musculoskeletal: No new muscle or joint pain Hematologic: No bleeding or bruising Psychiatric: No history of depression or anxiety    Physical Exam:  Vital signs: BP 112/80   Pulse (!) 102   Ht 5' 3 (1.6 m)   Wt 223 lb (101.2 kg)   LMP 10/04/2024   SpO2 97%   BMI 39.50 kg/m   Constitutional: NAD, alert and cooperative Head:  Normocephalic and atraumatic. Eyes:   PEERL, EOMI. No icterus. Conjunctiva pink. Respiratory: Respirations even and unlabored. Lungs clear to auscultation bilaterally.   No wheezes, crackles, or rhonchi.  Cardiovascular:  Regular rate and rhythm. No peripheral edema, cyanosis or pallor.  Gastrointestinal:  Soft, nondistended, nontender. No rebound or guarding. Normal bowel sounds. No appreciable masses or hepatomegaly. Rectal:  Declines Msk:  Symmetrical without gross deformities. Without edema, no deformity or joint abnormality.  Neurologic:  Alert and  oriented x4;  grossly normal neurologically.  Skin:   Dry and intact without significant  lesions or rashes. Psychiatric: Oriented to person, place and time. Demonstrates good judgement and reason without abnormal affect or behaviors.   RELEVANT LABS AND IMAGING: CBC    Component Value Date/Time   WBC 8.9 05/04/2024 2053   RBC 4.82 05/04/2024 2053   HGB 14.1 05/04/2024 2053   HGB 14.2 02/02/2024 0840   HCT 41.7 05/04/2024 2053   HCT 43.5 02/02/2024 0840   PLT 307 05/04/2024 2053   PLT 318 02/02/2024 0840   MCV 86.5 05/04/2024 2053   MCV 89 02/02/2024 0840   MCH 29.3 05/04/2024 2053   MCHC 33.8 05/04/2024 2053   RDW 13.4 05/04/2024 2053   RDW 12.7 02/02/2024 0840   LYMPHSABS 3.1 05/04/2024 2053   LYMPHSABS 2.8 02/02/2024 0840   MONOABS 0.6 05/04/2024 2053   EOSABS 0.1 05/04/2024 2053   EOSABS 0.1 02/02/2024 0840   BASOSABS 0.0 05/04/2024 2053   BASOSABS 0.1 02/02/2024 0840    CMP     Component Value Date/Time   NA 138 05/04/2024 2053   NA 139 02/02/2024 0840   K 4.0 05/04/2024 2053   CL 105 05/04/2024 2053   CO2 22 05/04/2024 2053   GLUCOSE 99 05/04/2024 2053   BUN 9 05/04/2024 2053   BUN 9 02/02/2024 0840   CREATININE 0.80 05/04/2024 2053   CREATININE 0.74 11/10/2020 0000   CALCIUM 9.0 05/04/2024 2053   PROT 7.5 05/04/2024 2053   PROT 6.8 02/02/2024 0840   ALBUMIN 4.4 05/04/2024 2053   ALBUMIN 4.0 02/02/2024 0840   AST 29 05/04/2024 2053   ALT 32 05/04/2024 2053   ALKPHOS 80 05/04/2024 2053   BILITOT 0.2 05/04/2024 2053   BILITOT 0.3 02/02/2024 0840   GFRNONAA >60 05/04/2024 2053   GFRNONAA 107 11/10/2020 0000   GFRAA 124 11/10/2020 0000     Assessment/Plan:    Hemorrhoids Rectal bleeding Family history of colon polyps in mother early 56s Seen in ED 07/2024 for BRBPR with EDP physical exam showing external hemorrhoid and patient was discharged with topical gel and follow-up with GI.  No anemia.  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 On phentermine in June which  caused  constipation likely leading to worsening hemorrhoids.  Though with family history of mother having precancerous polyps in her early 10s and having rectal bleeding it is reasonable to proceed with colonoscopy.  Patient is scheduled to see cardiology for workup of tachycardia.  - See cardiology for workup of tachycardia - Follow-up with me in January once cardiology assessment is complete - If cardiac assessment is negative pursue colonoscopy  Constipation Constipation since being put on phentermine likely exacerbating her hemorrhoids.  Currently well-controlled on MiraLAX Gummies. - Continue MiraLAX Gummies - Increase water , increase fiber, increase exercise  Tachycardia Patient to see cardiology this month for intermittent tachycardia  Layn Kye Mollie RIGGERS [facility] 10/04/2024, 4:10 PM  Cc: Booker Darice SAUNDERS, FNP

## 2024-10-04 NOTE — Patient Instructions (Addendum)
 _______________________________________________________  If your blood pressure at your visit was 140/90 or greater, please contact your primary care physician to follow up on this.  _______________________________________________________  If you are age 37 or older, your body mass index should be between 23-30. Your Body mass index is 39.5 kg/m. If this is out of the aforementioned range listed, please consider follow up with your Primary Care Provider.  If you are age 23 or younger, your body mass index should be between 19-25. Your Body mass index is 39.5 kg/m. If this is out of the aformentioned range listed, please consider follow up with your Primary Care Provider.   ________________________________________________________  The [facility] providers would like to encourage you to use MYCHART to communicate with providers for non-urgent requests or questions.  Due to long hold times on the telephone, sending your provider a message by [facility] may be a faster and more efficient way to get a response.  Please allow 48 business hours for a response.  Please remember that this is for non-urgent requests.  _______________________________________________________  [facility] is using a team-based approach to care.  Your team is made up of your doctor and two to three APPS. Our APPS (Nurse Practitioners and Physician Assistants) work with your physician to ensure care continuity for you. They are fully qualified to address your health concerns and develop a treatment plan. They communicate directly with your gastroenterologist to care for you. Seeing the Advanced Practice Practitioners on your physician's team can help you by facilitating care more promptly, often allowing for earlier appointments, access to diagnostic testing, procedures, and other specialty referrals.   You have been scheduled for an appointment with Nestor Blower PA-C on 12-02-2024 at 3pm . Please arrive 10 minutes  early for your appointment.  It was a pleasure to see you today!  Thank you for trusting me with your gastrointestinal care!

## 2024-10-16 NOTE — Progress Notes (Signed)
 Agree with the assessment and plan as outlined by Nestor Blower, PA-C.  Rectal bleeding very likely hemorrhoidal in etiology, but colonoscopy reasonable to definitively exclude other etiologies

## 2024-10-17 DIAGNOSIS — Z8619 Personal history of other infectious and parasitic diseases: Secondary | ICD-10-CM | POA: Insufficient documentation

## 2024-10-17 DIAGNOSIS — F32A Depression, unspecified: Secondary | ICD-10-CM | POA: Insufficient documentation

## 2024-10-17 DIAGNOSIS — A630 Anogenital (venereal) warts: Secondary | ICD-10-CM | POA: Insufficient documentation

## 2024-10-17 DIAGNOSIS — R87629 Unspecified abnormal cytological findings in specimens from vagina: Secondary | ICD-10-CM | POA: Insufficient documentation

## 2024-10-17 DIAGNOSIS — Z8742 Personal history of other diseases of the female genital tract: Secondary | ICD-10-CM | POA: Insufficient documentation

## 2024-10-17 DIAGNOSIS — O24419 Gestational diabetes mellitus in pregnancy, unspecified control: Secondary | ICD-10-CM | POA: Insufficient documentation

## 2024-10-17 DIAGNOSIS — G43909 Migraine, unspecified, not intractable, without status migrainosus: Secondary | ICD-10-CM | POA: Insufficient documentation

## 2024-10-23 ENCOUNTER — Ambulatory Visit: Attending: Cardiology | Admitting: Cardiology

## 2024-10-23 ENCOUNTER — Ambulatory Visit

## 2024-10-23 ENCOUNTER — Encounter: Payer: Self-pay | Admitting: Cardiology

## 2024-10-23 VITALS — BP 108/78 | HR 105 | Ht 63.0 in | Wt 221.2 lb

## 2024-10-23 DIAGNOSIS — R000 Tachycardia, unspecified: Secondary | ICD-10-CM | POA: Diagnosis not present

## 2024-10-23 DIAGNOSIS — R011 Cardiac murmur, unspecified: Secondary | ICD-10-CM | POA: Insufficient documentation

## 2024-10-23 DIAGNOSIS — R002 Palpitations: Secondary | ICD-10-CM | POA: Insufficient documentation

## 2024-10-23 DIAGNOSIS — Z8759 Personal history of other complications of pregnancy, childbirth and the puerperium: Secondary | ICD-10-CM

## 2024-10-23 NOTE — Progress Notes (Signed)
 Cardiology Office Note:    Date:  10/23/2024   ID:  Bailey Schwartz, DOB [birth year], MRN [id number]  PCP:  Booker Darice SAUNDERS, FNP  Cardiologist:  Jennifer SAUNDERS Crape, MD   Referring MD: Booker Darice SAUNDERS, FNP    ASSESSMENT:    1. Tachycardia   2. Palpitations   3. History of pre-eclampsia   4. Morbid obesity (HCC)   5. Cardiac murmur    PLAN:    In order of problems listed above:  Primary prevention stressed with the patient.  Importance of compliance with diet medication stressed and patient verbalized standing. Palpitations: This is very occasional.  Her elevated heart rate was found during a routine evaluation with the weight loss clinic.  Will do a 2-week monitor to assess this.  Will check her TSH.  I took a detailed history and she does not do carbonated beverages or drinks like caffeinated sodas or drinks. Cardiac murmur: Echocardiogram will be done to assess murmur heard on auscultation. Obesity: Weight reduction stressed diet emphasized and she promises to do better. Patient will be seen in follow-up appointment in 6 months or earlier if the patient has any concerns.    Medication Adjustments/Labs and Tests Ordered: Current medicines are reviewed at length with the patient today.  Concerns regarding medicines are outlined above.  Orders Placed This Encounter  Procedures   EKG 12-Lead   EKG 12-Lead   No orders of the defined types were placed in this encounter.    History of Present Illness:    Bailey Schwartz is a 38 y.o. female who is being seen today for the evaluation of palpitations at the request of Booker Darice SAUNDERS, FNP.  Patient is a pleasant 38 year old female.  She is under the weight loss clinic at [facility].  She gives me history of elevated heart rate.  She occasionally feels palpitations but otherwise she is asymptomatic.  No chest pain orthopnea or PND.  At the time of my evaluation, the patient is alert awake oriented and in no distress.  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   Depression, major, single episode, mild 01/11/2023   Encounter for lipid screening for cardiovascular disease 12/21/2022   Genital warts    Gestational diabetes    History of abnormal cervical Pap smear    Hx of HPV   History of chicken pox    History of gestational diabetes 12/31/2019   History of migraine 12/31/2019   History of pre-eclampsia 12/31/2019   Insomnia 11/01/2023   Intractable periodic headache syndrome 05/02/2018   Migraine    Migraine without aura and with status migrainosus, not intractable 10/15/2021   Preeclampsia    Screening for thyroid  disorder 05/16/2023   Single, 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  VUS in KIT c.577A>C (p.Lys193Gln) and SMARCE1 c.955G>A (p.Val319Ile).  Report date is 05/09/2023.      The Multi-Cancer + RNA Panel offered by [facility] includes sequencing and/or deletion/duplication analysis of the following 70 genes:  AIP*, ALK, APC*, ATM*, AXIN2*, BAP1*, BARD1*, BLM*, BMPR1A*, BRCA1*, BRCA2*, BRIP1*, CDC   Strain of groin, left, initial encounter 08/21/2023   Suspicious nevus 03/07/2024   Tachycardia with heart rate 121-140 beats per minute 05/15/2024  Vaginal Pap smear, abnormal    White classification A1 gestational diabetes mellitus (GDM) 09/17/2022    Past Surgical History:  Procedure Laterality Date   CESAREAN SECTION N/A 09/18/2022   Procedure: CESAREAN SECTION;  Surgeon: Rutherford Gain, MD;  Location: [facility] LD ORS;  Service: Obstetrics;  Laterality: N/A;   REFRACTIVE SURGERY      Current Medications: Current Meds  Medication Sig   amitriptyline  (ELAVIL ) 50 MG tablet Take 1 tablet (50 mg  total) by mouth at bedtime.   methylPREDNISolone  (MEDROL  DOSEPAK) 4 MG TBPK tablet Taper pack as directed   naproxen  (NAPROSYN ) 500 MG tablet Take 1 tablet (500 mg total) by mouth 2 (two) times daily with a meal.   phentermine 15 MG capsule Take 15 mg by mouth daily.   topiramate  (TOPAMAX ) 25 MG tablet Take by mouth.   triamcinolone  cream (KENALOG ) 0.1 % Apply 1 Application topically 2 (two) times daily.   Ubrogepant  (UBRELVY ) 100 MG TABS Take 1 tablet (100 mg total) by mouth as needed (for migraine). May repeat a dose in 2 hours if needed   zolmitriptan  (ZOMIG ) 5 MG tablet Take 1 tablet (5 mg total) by mouth as needed for migraine.     Allergies:   Sulfamethoxazole-trimethoprim and Fluconazole   Social History   Socioeconomic History   Marital status: Married    Spouse name: Not on file   Number of children: 1   Years of education: Not on file   Highest education level: Bachelor's degree (e.g., BA, AB, BS)  Occupational History    Employer: [employer]  Tobacco Use   Smoking status: Former    Current packs/day: 0.18    Types: Cigarettes   Smokeless tobacco: Never  Vaping Use   Vaping status: Never Used  Substance and Sexual Activity   Alcohol use: Not Currently   Drug use: Not Currently   Sexual activity: Not on file  Other Topics Concern   Not on file  Social History Narrative   Not on file   Social Drivers of Health   Financial Resource Strain: Low Risk  (07/09/2024)   Overall Financial Resource Strain (CARDIA)    Difficulty of Paying Living Expenses: Not hard at all  Food Insecurity: No Food Insecurity (07/09/2024)   Hunger Vital Sign    Worried About Running Out of Food in the Last Year: Never true    Ran Out of Food in the Last Year: Never true  Transportation Needs: No Transportation Needs (07/09/2024)   PRAPARE - Administrator, Civil Service (Medical): No    Lack of Transportation (Non-Medical): No  Physical Activity: Sufficiently  Active (07/09/2024)   Exercise Vital Sign    Days of Exercise per Week: 4 days    Minutes of Exercise per Session: 40 min  Stress: No Stress Concern Present (07/09/2024)   [employer] of Occupational Health - Occupational Stress Questionnaire    Feeling of Stress: Only a little  Social Connections: Moderately Isolated (07/09/2024)   Social Connection and Isolation Panel    Frequency of Communication with Friends and Family: More than three times a week    Frequency of Social Gatherings with Friends and Family: Three times a week    Attends Religious Services: Never    Active Member of Clubs or Organizations: No    Attends Engineer, Structural: Not on file    Marital Status: Married     Family History: The patient's family history includes Aneurysm in  her maternal grandfather; Bone cancer in her maternal uncle; Colon polyps in her father and mother; Diabetes in her mother; Esophageal cancer in her maternal grandmother; Hyperlipidemia in her maternal grandmother, paternal grandfather, and paternal grandmother; Hypertension in her maternal grandmother, mother, paternal grandfather, and paternal grandmother; Miscarriages / Stillbirths in her mother; Prostate cancer in her maternal uncle; Stroke in her paternal grandfather.  ROS:   Please see the history of present illness.    All other systems reviewed and are negative.  EKGs/Labs/Other Studies Reviewed:    The following studies were reviewed today:  EKG Interpretation Date/Time:  Wednesday October 23 2024 15:40:37 EST Ventricular Rate:  105 PR Interval:  144 QRS Duration:  92 QT Interval:  344 QTC Calculation: 454 R Axis:   83  Text Interpretation: Sinus tachycardia Anterior infarct (cited on or before 23-Oct-2024) When compared with ECG of 23-Oct-2024 15:40, Sinus rhythm has replaced Junctional rhythm Confirmed by Edwyna Backers [phone]) on 10/23/2024 4:02:05 PM     Recent Labs: 02/02/2024: TSH 1.970 05/04/2024: ALT  32; BUN 9; Creatinine, Ser 0.80; Hemoglobin 14.1; Platelets 307; Potassium 4.0; Sodium 138  Recent Lipid Panel    Component Value Date/Time   CHOL 172 02/02/2024 0840   TRIG 87 02/02/2024 0840   HDL 44 02/02/2024 0840   CHOLHDL 3.9 02/02/2024 0840   CHOLHDL 2.6 12/31/2019 1155   LDLCALC 112 (H) 02/02/2024 0840   LDLCALC 81 12/31/2019 1155    Physical Exam:    VS:  BP 108/78   Pulse (!) 105   Ht 5' 3 (1.6 m)   Wt 221 lb 2.4 oz (100.3 kg)   LMP 10/04/2024   SpO2 95%   BMI 39.18 kg/m     Wt Readings from Last 3 Encounters:  10/23/24 221 lb 2.4 oz (100.3 kg)  10/04/24 223 lb (101.2 kg)  08/04/24 235 lb (106.6 kg)     GEN: Patient is in no acute distress HEENT: Normal NECK: No JVD; No carotid bruits LYMPHATICS: No lymphadenopathy CARDIAC: S1 S2 regular, 2/6 systolic murmur at the apex. RESPIRATORY:  Clear to auscultation without rales, wheezing or rhonchi  ABDOMEN: Soft, non-tender, non-distended MUSCULOSKELETAL:  No edema; No deformity  SKIN: Warm and dry NEUROLOGIC:  Alert and oriented x 3 PSYCHIATRIC:  Normal affect    Signed, Backers JONELLE Edwyna, MD  10/23/2024 4:10 PM    [facility]

## 2024-10-23 NOTE — Patient Instructions (Signed)
 Medication Instructions:  Your physician recommends that you continue on your current medications as directed. Please refer to the Current Medication list given to you today.  *If you need a refill on your cardiac medications before your next appointment, please call your pharmacy*   Lab Work: [facility] Your physician recommends that you return for lab work. Your labs have been ordered.  You need to have labs done when you are fasting.  You can come Monday through Friday 8:00 am to 11:30 am and 1:00 to 4:00 pm. You do not need to make an appointment as the order has already been placed.   If you have labs (blood work) drawn today and your tests are completely normal, you will receive your results only by: MyChart Message (if you have MyChart) OR A paper copy in the mail If you have any lab test that is abnormal or we need to change your treatment, we will call you to review the results.  Testing/Procedures: Your physician has requested that you have an echocardiogram. Echocardiography is a painless test that uses sound waves to create images of your heart. It provides your doctor with information about the size and shape of your heart and how well your heart's chambers and valves are working. This procedure takes approximately one hour. There are no restrictions for this procedure. Please do NOT wear cologne, perfume, aftershave, or lotions (deodorant is allowed). Please arrive 15 minutes prior to your appointment time.  Please note: We ask at that you not bring children with you during ultrasound (echo/ vascular) testing. Due to room size and safety concerns, children are not allowed in the ultrasound rooms during exams. Our front office staff cannot provide observation of children in our lobby area while testing is being conducted. An adult accompanying a patient to their appointment will only be allowed in the ultrasound room at the discretion of the ultrasound technician under special  circumstances. We apologize for any inconvenience.  Follow-Up: At [facility], you and your health needs are our priority.  As part of our continuing mission to provide you with exceptional heart care, we have created designated Provider Care Teams.  These Care Teams include your primary Cardiologist (physician) and Advanced Practice Providers (APPs -  Physician Assistants and Nurse Practitioners) who all work together to provide you with the care you need, when you need it.  We recommend signing up for the patient portal called MyChart.  Sign up information is provided on this After Visit Summary.  MyChart is used to connect with patients for Virtual Visits (Telemedicine).  Patients are able to view lab/test results, encounter notes, upcoming appointments, etc.  Non-urgent messages can be sent to your provider as well.   To learn more about what you can do with MyChart, go to [email or web address].    Your next appointment:   12 month(s)  The format for your next appointment:   In Person  Provider:   Jennifer Crape, MD   Other Instructions Echocardiogram An echocardiogram is a test that uses sound waves (ultrasound) to produce images of the heart. Images from an echocardiogram can provide important information about: Heart size and shape. The size and thickness and movement of your heart's walls. Heart muscle function and strength. Heart valve function or if you have stenosis. Stenosis is when the heart valves are too narrow. If blood is flowing backward through the heart valves (regurgitation). A tumor or infectious growth around the heart valves. Areas of heart muscle that are not working  well because of poor blood flow or injury from a heart attack. Aneurysm detection. An aneurysm is a weak or damaged part of an artery wall. The wall bulges out from the normal force of blood pumping through the body. Tell a health care provider about: Any allergies you have. All medicines  you are taking, including vitamins, herbs, eye drops, creams, and over-the-counter medicines. Any blood disorders you have. Any surgeries you have had. Any medical conditions you have. Whether you are pregnant or may be pregnant. What are the risks? Generally, this is a safe test. However, problems may occur, including an allergic reaction to dye (contrast) that may be used during the test. What happens before the test? No specific preparation is needed. You may eat and drink normally. What happens during the test? You will take off your clothes from the waist up and put on a hospital gown. Electrodes or electrocardiogram (ECG)patches may be placed on your chest. The electrodes or patches are then connected to a device that monitors your heart rate and rhythm. You will lie down on a table for an ultrasound exam. A gel will be applied to your chest to help sound waves pass through your skin. A handheld device, called a transducer, will be pressed against your chest and moved over your heart. The transducer produces sound waves that travel to your heart and bounce back (or echo back) to the transducer. These sound waves will be captured in real-time and changed into images of your heart that can be viewed on a video monitor. The images will be recorded on a computer and reviewed by your health care provider. You may be asked to change positions or hold your breath for a short time. This makes it easier to get different views or better views of your heart. In some cases, you may receive contrast through an IV in one of your veins. This can improve the quality of the pictures from your heart. The procedure may vary among health care providers and hospitals.   What can I expect after the test? You may return to your normal, everyday life, including diet, activities, and medicines, unless your health care provider tells you not to do that. Follow these instructions at home: It is up to you to get the  results of your test. Ask your health care provider, or the department that is doing the test, when your results will be ready. Keep all follow-up visits. This is important. Summary An echocardiogram is a test that uses sound waves (ultrasound) to produce images of the heart. Images from an echocardiogram can provide important information about the size and shape of your heart, heart muscle function, heart valve function, and other possible heart problems. You do not need to do anything to prepare before this test. You may eat and drink normally. After the echocardiogram is completed, you may return to your normal, everyday life, unless your health care provider tells you not to do that. This information is not intended to replace advice given to you by your health care provider. Make sure you discuss any questions you have with your health care provider. Document Revised: 07/07/2020 Document Reviewed: 07/07/2020 Elsevier Patient Education  2021 Elsevier Inc.   Important Information About Sugar

## 2024-10-27 ENCOUNTER — Encounter: Payer: Self-pay | Admitting: Cardiology

## 2024-10-28 ENCOUNTER — Other Ambulatory Visit: Payer: Self-pay | Admitting: Cardiology

## 2024-10-28 DIAGNOSIS — R002 Palpitations: Secondary | ICD-10-CM

## 2024-10-28 DIAGNOSIS — R000 Tachycardia, unspecified: Secondary | ICD-10-CM

## 2024-10-28 DIAGNOSIS — Z8759 Personal history of other complications of pregnancy, childbirth and the puerperium: Secondary | ICD-10-CM

## 2024-10-28 DIAGNOSIS — R011 Cardiac murmur, unspecified: Secondary | ICD-10-CM

## 2024-10-31 ENCOUNTER — Encounter: Admitting: Gastroenterology

## 2024-11-18 ENCOUNTER — Ambulatory Visit (HOSPITAL_BASED_OUTPATIENT_CLINIC_OR_DEPARTMENT_OTHER)
Admission: RE | Admit: 2024-11-18 | Discharge: 2024-11-18 | Disposition: A | Discharge status: Home / Self Care | Source: Ambulatory Visit | Attending: Cardiology | Admitting: Cardiology

## 2024-11-18 DIAGNOSIS — R002 Palpitations: Secondary | ICD-10-CM | POA: Diagnosis present

## 2024-11-18 DIAGNOSIS — R011 Cardiac murmur, unspecified: Secondary | ICD-10-CM | POA: Diagnosis present

## 2024-11-18 DIAGNOSIS — R000 Tachycardia, unspecified: Secondary | ICD-10-CM | POA: Insufficient documentation

## 2024-11-18 LAB — ECHOCARDIOGRAM COMPLETE
AR max vel: 2.2 cm2
AV Area VTI: 2.22 cm2
AV Area mean vel: 2.06 cm2
AV Mean grad: 5 mmHg
AV Peak grad: 7.6 mmHg
Ao pk vel: 1.38 m/s
Area-P 1/2: 5.31 cm2
Calc EF: 62.4 %
S' Lateral: 2.6 cm
Single Plane A2C EF: 60 %
Single Plane A4C EF: 63.6 %

## 2024-11-19 ENCOUNTER — Ambulatory Visit: Payer: Self-pay | Admitting: Cardiology

## 2024-11-19 DIAGNOSIS — R000 Tachycardia, unspecified: Secondary | ICD-10-CM | POA: Diagnosis not present

## 2024-11-19 DIAGNOSIS — Z8759 Personal history of other complications of pregnancy, childbirth and the puerperium: Secondary | ICD-10-CM

## 2024-11-19 DIAGNOSIS — R011 Cardiac murmur, unspecified: Secondary | ICD-10-CM | POA: Diagnosis not present

## 2024-11-19 DIAGNOSIS — R002 Palpitations: Secondary | ICD-10-CM | POA: Diagnosis not present

## 2024-11-19 LAB — TSH: TSH: 1.7 u[IU]/mL (ref 0.450–4.500)

## 2024-12-02 ENCOUNTER — Encounter: Payer: Self-pay | Admitting: Gastroenterology

## 2024-12-02 ENCOUNTER — Ambulatory Visit: Admitting: Gastroenterology

## 2024-12-02 VITALS — BP 126/78 | HR 105 | Ht 63.0 in | Wt 220.2 lb

## 2024-12-02 DIAGNOSIS — K649 Unspecified hemorrhoids: Secondary | ICD-10-CM

## 2024-12-02 DIAGNOSIS — K5900 Constipation, unspecified: Secondary | ICD-10-CM

## 2024-12-02 DIAGNOSIS — Z83719 Family history of colon polyps, unspecified: Secondary | ICD-10-CM

## 2024-12-02 DIAGNOSIS — K625 Hemorrhage of anus and rectum: Secondary | ICD-10-CM | POA: Diagnosis not present

## 2024-12-02 MED ORDER — HYDROCORTISONE (PERIANAL) 2.5 % EX CREA: 1.0000 | TOPICAL_CREAM | Freq: Every day | CUTANEOUS | 1 refills | Status: AC

## 2024-12-02 NOTE — Patient Instructions (Signed)
 We have sent the following medications to your pharmacy for you to pick up at your convenience:  Anusol  HC cream  You have been scheduled for a colonoscopy. Please follow written instructions given to you at your visit today.   If you use inhalers (even only as needed), please bring them with you on the day of your procedure.  DO NOT TAKE 7 DAYS PRIOR TO TEST- Trulicity (dulaglutide) Ozempic, Wegovy (semaglutide) Mounjaro, Zepbound (tirzepatide) Bydureon Bcise (exanatide extended release)  DO NOT TAKE 1 DAY PRIOR TO YOUR TEST Rybelsus (semaglutide) Adlyxin (lixisenatide) Victoza (liraglutide) Byetta (exanatide) ___________________________________________________________________________  _______________________________________________________  If your blood pressure at your visit was 140/90 or greater, please contact your primary care physician to follow up on this.  _______________________________________________________  If you are age 34 or older, your body mass index should be between 23-30. Your Body mass index is 39.02 kg/m. If this is out of the aforementioned range listed, please consider follow up with your Primary Care Provider.  If you are age 23 or younger, your body mass index should be between 19-25. Your Body mass index is 39.02 kg/m. If this is out of the aformentioned range listed, please consider follow up with your Primary Care Provider.   ________________________________________________________  The [facility] providers would like to encourage you to use MYCHART to communicate with providers for non-urgent requests or questions.  Due to long hold times on the telephone, sending your provider a message by [facility] may be a faster and more efficient way to get a response.  Please allow 48 business hours for a response.  Please remember that this is for non-urgent requests.  _______________________________________________________  [facility] is using a  team-based approach to care.  Your team is made up of your doctor and two to three APPS. Our APPS (Nurse Practitioners and Physician Assistants) work with your physician to ensure care continuity for you. They are fully qualified to address your health concerns and develop a treatment plan. They communicate directly with your gastroenterologist to care for you. Seeing the Advanced Practice Practitioners on your physician's team can help you by facilitating care more promptly, often allowing for earlier appointments, access to diagnostic testing, procedures, and other specialty referrals.

## 2024-12-02 NOTE — Progress Notes (Signed)
 "  Chief Complaint: Follow-up Primary GI MD: Dr. Stacia  HPI: Discussed the use of AI scribe software for clinical note transcription with the patient, who gave verbal consent to proceed.  History of Present Illness   Bailey Schwartz is a 39 year old female with hemorrhoids and constipation who presents for evaluation of rectal bleeding and irritation.  Over the past weekend, she experienced a recurrence of very light rectal bleeding, noted only on wiping and not visible in the toilet, accompanied by anorectal irritation. Symptoms are associated with increased weight lifting, which tends to worsen irritation. Application of over-the-counter hemorrhoid cream improved her symptoms. She has not previously used prescription hemorrhoid cream but is open to trying it if needed.  Constipation has improved with consistent use of Miralax, and she continues this regimen. Bowel movements are regular with no current issues with constipation.  Her family history is concerning to her, as her mother had similar issues at a similar age and was found to have a precancerous polyp during a colonoscopy. Her father also had a polyp found, though details are less clear. This family history increases her concern about the rectal bleeding and the potential for colorectal issues.   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   Depression, major, single episode, mild 01/11/2023   Encounter for lipid screening for cardiovascular disease 12/21/2022   Genital warts    Gestational diabetes    History of  abnormal cervical Pap smear    Hx of HPV   History of chicken pox    History of gestational diabetes 12/31/2019   History of migraine 12/31/2019   History of pre-eclampsia 12/31/2019   Insomnia 11/01/2023   Intractable periodic headache syndrome 05/02/2018   Migraine    Migraine without aura and with status migrainosus, not intractable 10/15/2021   Preeclampsia    Screening for thyroid  disorder 05/16/2023   Single, 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  VUS in KIT c.577A>C (p.Lys193Gln) and SMARCE1 c.955G>A (p.Val319Ile).  Report date is 05/09/2023.      The Multi-Cancer + RNA Panel offered by [facility] includes sequencing and/or deletion/duplication analysis of the following 70 genes:  AIP*, ALK, APC*, ATM*, AXIN2*, BAP1*, BARD1*, BLM*, BMPR1A*, BRCA1*, BRCA2*, BRIP1*, CDC   Strain of groin, left, initial encounter 08/21/2023   Suspicious nevus 03/07/2024   Tachycardia with heart rate 121-140 beats per minute 05/15/2024   Vaginal Pap smear, abnormal    White classification A1 gestational diabetes mellitus (GDM) 09/17/2022    Past Surgical History:  Procedure Laterality Date   CESAREAN SECTION N/A 09/18/2022   Procedure: CESAREAN SECTION;  Surgeon: Rutherford Gain, MD;  Location: [facility] LD ORS;  Service: Obstetrics;  Laterality: N/A;   REFRACTIVE SURGERY      Current Outpatient Medications  Medication Sig Dispense Refill   amitriptyline  (ELAVIL ) 50 MG tablet Take 1 tablet (50 mg total) by mouth at bedtime. 90 tablet 3   hydrocortisone  (ANUSOL -HC) 2.5 % rectal cream Place 1 Application rectally daily. 30 g 1   methylPREDNISolone  (MEDROL  DOSEPAK) 4 MG TBPK tablet Taper pack as directed 1 each 0  naproxen  (NAPROSYN ) 500 MG tablet Take 1 tablet (500 mg total) by mouth 2 (two) times daily with a meal. 30 tablet 0   phentermine 15 MG capsule Take 15 mg by mouth daily.     topiramate  (TOPAMAX ) 25 MG tablet Take by mouth.     triamcinolone  cream (KENALOG ) 0.1 %  Apply 1 Application topically 2 (two) times daily. 30 g 0   Ubrogepant  (UBRELVY ) 100 MG TABS Take 1 tablet (100 mg total) by mouth as needed (for migraine). May repeat a dose in 2 hours if needed 16 tablet 7   zolmitriptan  (ZOMIG ) 5 MG tablet Take 1 tablet (5 mg total) by mouth as needed for migraine. 10 tablet 6   No current facility-administered medications for this visit.    Allergies as of 12/02/2024 - Review Complete 12/02/2024  Allergen Reaction Noted   Sulfamethoxazole-trimethoprim Hives, Swelling, and Rash 08/26/2014   Fluconazole Hives, Swelling, and Rash 08/19/2014    Family History  Problem Relation Age of Onset   Colon polyps Mother        unknown number   Miscarriages / Stillbirths Mother    Hypertension Mother    Diabetes Mother    Colon polyps Father        less than 10 lifetime polyps   Bone cancer Maternal Uncle        or other primary? mother's paternal half brother; d. 73-70s   Prostate cancer Maternal Uncle        dx 60s-70s   Esophageal cancer Maternal Grandmother        or lymphoma? d. 13s   Hyperlipidemia Maternal Grandmother    Hypertension Maternal Grandmother    Aneurysm Maternal Grandfather    Hyperlipidemia Paternal Grandmother    Hypertension Paternal Grandmother    Hyperlipidemia Paternal Grandfather    Hypertension Paternal Grandfather    Stroke Paternal Grandfather     Social History   Socioeconomic History   Marital status: Married    Spouse name: Not on file   Number of children: 1   Years of education: Not on file   Highest education level: Bachelor's degree (e.g., BA, AB, BS)  Occupational History    Employer: [employer]  Tobacco Use   Smoking status: Former    Current packs/day: 0.18    Types: Cigarettes   Smokeless tobacco: Never  Vaping Use   Vaping status: Never Used  Substance and Sexual Activity   Alcohol use: Not Currently   Drug use: Not Currently   Sexual activity: Not on file  Other Topics  Concern   Not on file  Social History Narrative   Not on file   Social Drivers of Health   Tobacco Use: Medium Risk (12/02/2024)   Patient History    Smoking Tobacco Use: Former    Smokeless Tobacco Use: Never    Passive Exposure: Not on file  Financial Resource Strain: Low Risk (07/09/2024)   Overall Financial Resource Strain (CARDIA)    Difficulty of Paying Living Expenses: Not hard at all  Food Insecurity: No Food Insecurity (07/09/2024)   Epic    Worried About Radiation Protection Practitioner of Food in the Last Year: Never true    Ran Out of Food in the Last Year: Never true  Transportation Needs: No Transportation Needs (07/09/2024)   Epic    Lack of Transportation (Medical): No    Lack of Transportation (Non-Medical): No  Physical Activity: Sufficiently Active (07/09/2024)   Exercise Vital Sign  Days of Exercise per Week: 4 days    Minutes of Exercise per Session: 40 min  Stress: No Stress Concern Present (07/09/2024)   [employer] of Occupational Health - Occupational Stress Questionnaire    Feeling of Stress: Only a little  Social Connections: Moderately Isolated (07/09/2024)   Social Connection and Isolation Panel    Frequency of Communication with Friends and Family: More than three times a week    Frequency of Social Gatherings with Friends and Family: Three times a week    Attends Religious Services: Never    Active Member of Clubs or Organizations: No    Attends Banker Meetings: Not on file    Marital Status: Married  Catering Manager Violence: Not At Risk (04/27/2023)   Humiliation, Afraid, Rape, and Kick questionnaire    Fear of Current or Ex-Partner: No    Emotionally Abused: No    Physically Abused: No    Sexually Abused: No  Depression (PHQ2-9): Low Risk (07/09/2024)   Depression (PHQ2-9)    PHQ-2 Score: 0  Recent Concern: Depression (PHQ2-9) - Medium Risk (05/08/2024)   Depression (PHQ2-9)    PHQ-2 Score: 5  Alcohol Screen: Low Risk (07/09/2024)   Alcohol  Screen    Last Alcohol Screening Score (AUDIT): 0  Housing: Low Risk (07/09/2024)   Epic    Unable to Pay for Housing in the Last Year: No    Number of Times Moved in the Last Year: 0    Homeless in the Last Year: No  Utilities: Not At Risk (04/27/2023)   [facility] Utilities    Threatened with loss of utilities: No  Health Literacy: Not on file    Review of Systems:    Constitutional: No weight loss, fever, chills, weakness or fatigue HEENT: Eyes: No change in vision               Ears, Nose, Throat:  No change in hearing or congestion Skin: No rash or itching Cardiovascular: No chest pain, chest pressure or palpitations   Respiratory: No SOB or cough Gastrointestinal: See HPI and otherwise negative Genitourinary: No dysuria or change in urinary frequency Neurological: No headache, dizziness or syncope Musculoskeletal: No new muscle or joint pain Hematologic: No bleeding or bruising Psychiatric: No history of depression or anxiety    Physical Exam:  Vital signs: BP 126/78   Pulse (!) 105   Ht 5' 3 (1.6 m)   Wt 220 lb 4 oz (99.9 kg)   LMP 11/20/2024   SpO2 95%   BMI 39.02 kg/m   Constitutional: NAD, alert and cooperative Head:  Normocephalic and atraumatic. Eyes:   PEERL, EOMI. No icterus. Conjunctiva pink. Respiratory: Respirations even and unlabored. Lungs clear to auscultation bilaterally.   No wheezes, crackles, or rhonchi.  Cardiovascular:  Regular rate and rhythm. No peripheral edema, cyanosis or pallor.  Gastrointestinal:  Soft, nondistended, nontender. No rebound or guarding. Normal bowel sounds. No appreciable masses or hepatomegaly. Rectal:  Declines Msk:  Symmetrical without gross deformities. Without edema, no deformity or joint abnormality.  Neurologic:  Alert and  oriented x4;  grossly normal neurologically.  Skin:   Dry and intact without significant lesions or rashes. Psychiatric: Oriented to person, place and time. Demonstrates good judgement and reason  without abnormal affect or behaviors.  Physical Exam    RELEVANT LABS AND IMAGING: CBC    Component Value Date/Time   WBC 8.9 05/04/2024 2053   RBC 4.82 05/04/2024 2053   HGB 14.1 05/04/2024 2053  HGB 14.2 02/02/2024 0840   HCT 41.7 05/04/2024 2053   HCT 43.5 02/02/2024 0840   PLT 307 05/04/2024 2053   PLT 318 02/02/2024 0840   MCV 86.5 05/04/2024 2053   MCV 89 02/02/2024 0840   MCH 29.3 05/04/2024 2053   MCHC 33.8 05/04/2024 2053   RDW 13.4 05/04/2024 2053   RDW 12.7 02/02/2024 0840   LYMPHSABS 3.1 05/04/2024 2053   LYMPHSABS 2.8 02/02/2024 0840   MONOABS 0.6 05/04/2024 2053   EOSABS 0.1 05/04/2024 2053   EOSABS 0.1 02/02/2024 0840   BASOSABS 0.0 05/04/2024 2053   BASOSABS 0.1 02/02/2024 0840    CMP     Component Value Date/Time   NA 138 05/04/2024 2053   NA 139 02/02/2024 0840   K 4.0 05/04/2024 2053   CL 105 05/04/2024 2053   CO2 22 05/04/2024 2053   GLUCOSE 99 05/04/2024 2053   BUN 9 05/04/2024 2053   BUN 9 02/02/2024 0840   CREATININE 0.80 05/04/2024 2053   CREATININE 0.74 11/10/2020 0000   CALCIUM 9.0 05/04/2024 2053   PROT 7.5 05/04/2024 2053   PROT 6.8 02/02/2024 0840   ALBUMIN 4.4 05/04/2024 2053   ALBUMIN 4.0 02/02/2024 0840   AST 29 05/04/2024 2053   ALT 32 05/04/2024 2053   ALKPHOS 80 05/04/2024 2053   BILITOT 0.2 05/04/2024 2053   BILITOT 0.3 02/02/2024 0840   GFRNONAA >60 05/04/2024 2053   GFRNONAA 107 11/10/2020 0000   GFRAA 124 11/10/2020 0000     Assessment/Plan:   Hemorrhoids Rectal bleeding Family history of colon polyps in mother early 30s  No anemia.  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 On phentermine in June which caused constipation likely leading to worsening hemorrhoids in addition to weight lifting.  Though with family history of mother having precancerous polyps in her early 49s and having rectal bleeding it is reasonable to proceed with  colonoscopy.  Negative cardiac workup for tachycardia - Colonoscopy - If hemorrhoids amenable to banding can set up banding after colonoscopy - Hydrocortisone  cream twice daily for 14 days - Increase water , increase fiber, increase exercise - Sitz bath's as tolerated - I thoroughly discussed the procedure with the patient (at bedside) to include nature of the procedure, alternatives, benefits, and risks (including but not limited to bleeding, infection, perforation, anesthesia/cardiac pulmonary complications).  Patient verbalized understanding and gave verbal consent to proceed with procedure.   Constipation Symptoms well-controlled with regular use of polyethylene glycol (Miralax), with improved stool consistency. - Advised continuation of Miralax to maintain regular bowel movements and ensure adequate bowel preparation for colonoscopy.   Nestor Mollie RIGGERS [facility] 12/02/2024, 4:08 PM  Cc: Booker Darice SAUNDERS, FNP "

## 2024-12-10 NOTE — Progress Notes (Signed)
 Agree with the assessment and plan as outlined by Boone Master, PA-C.

## 2024-12-11 NOTE — Telephone Encounter (Signed)
 Left a voicemail to patient to make some additional program appointments please schedule patient at [facility] [address]: [address] 6:6 Ess Billed

## 2024-12-17 MED ORDER — NA SULFATE-K SULFATE-MG SULF 17.5-3.13-1.6 GM/177ML PO SOLN: ORAL | 0 refills | Status: DC

## 2025-01-02 ENCOUNTER — Encounter: Payer: Self-pay | Admitting: Gastroenterology

## 2025-01-02 ENCOUNTER — Ambulatory Visit: Admitting: Gastroenterology

## 2025-01-02 VITALS — BP 120/84 | HR 87 | Temp 98.2°F | Resp 14 | Ht 63.0 in | Wt 220.0 lb

## 2025-01-02 DIAGNOSIS — K649 Unspecified hemorrhoids: Secondary | ICD-10-CM

## 2025-01-02 DIAGNOSIS — D125 Benign neoplasm of sigmoid colon: Secondary | ICD-10-CM

## 2025-01-02 DIAGNOSIS — Z83719 Family history of colon polyps, unspecified: Secondary | ICD-10-CM

## 2025-01-02 DIAGNOSIS — D128 Benign neoplasm of rectum: Secondary | ICD-10-CM

## 2025-01-02 DIAGNOSIS — K625 Hemorrhage of anus and rectum: Secondary | ICD-10-CM

## 2025-01-02 MED ORDER — SODIUM CHLORIDE 0.9 % IV SOLN: 500.0000 mL | INTRAVENOUS | Status: DC

## 2025-01-02 NOTE — Progress Notes (Signed)
 [facility] History and Physical   Primary Care Physician:  Booker Darice SAUNDERS, FNP   Reason for Procedure:   Rectal bleeding  Plan:    Colonoscopy  HPI: Bailey Schwartz is a 39 y.o. female undergoing colonoscopy to evaluate rectal bleeding.  Her mother was found to have a precancerous polyp during a colonoscopy to evaluate rectal bleeding.  She has no family history of colon cancer.  She has not seen any blood in several months   The patient was provided an opportunity to ask questions and all were answered. The patient agreed with the plan   Past Medical History:  Diagnosis Date   Abnormal weight gain 12/31/2019   Acute midline back pain 05/07/2024   Anxiety 12/31/2019   Axillary lump, left 11/01/2023   Bleeding hemorrhoid 08/05/2024   Chronic migraine without aura 09/14/2021   Depression    Depression, major, single episode, complete remission 05/02/2018   Depression, major, single episode, mild 01/11/2023   Encounter for lipid screening for cardiovascular disease 12/21/2022   Genital warts    Gestational diabetes    History of abnormal cervical Pap smear    Hx of HPV   History of chicken pox    History of gestational diabetes 12/31/2019   History of migraine 12/31/2019   History of pre-eclampsia 12/31/2019   Insomnia 11/01/2023   Intractable periodic headache syndrome 05/02/2018   Migraine    Migraine without aura and with status migrainosus, not intractable 10/15/2021   Preeclampsia    Screening for thyroid  disorder 05/16/2023   Single, 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  VUS in KIT c.577A>C (p.Lys193Gln) and SMARCE1 c.955G>A (p.Val319Ile).  Report date is 05/09/2023.      The Multi-Cancer + RNA Panel offered by [facility] includes sequencing and/or deletion/duplication analysis of the following 70 genes:  AIP*, ALK, APC*, ATM*, AXIN2*, BAP1*, BARD1*, BLM*, BMPR1A*, BRCA1*, BRCA2*, BRIP1*, CDC   Strain of groin, left, initial  encounter 08/21/2023   Suspicious nevus 03/07/2024   Tachycardia with heart rate 121-140 beats per minute 05/15/2024   Vaginal Pap smear, abnormal    White classification A1 gestational diabetes mellitus (GDM) 09/17/2022    Past Surgical History:  Procedure Laterality Date   CESAREAN SECTION N/A 09/18/2022   Procedure: CESAREAN SECTION;  Surgeon: Rutherford Gain, MD;  Location: [facility] LD ORS;  Service: Obstetrics;  Laterality: N/A;   REFRACTIVE SURGERY      Prior to Admission medications  Medication Sig Start Date End Date Taking? Authorizing Provider  amitriptyline  (ELAVIL ) 50 MG tablet Take 1 tablet (50 mg total) by mouth at bedtime. 05/09/24  Yes McCue, Harlene, NP  topiramate  (TOPAMAX ) 25 MG tablet Take by mouth.   Yes [provider]  hydrocortisone  (ANUSOL -HC) 2.5 % rectal cream Place 1 Application rectally daily. 12/02/24   McMichael, Nestor HERO, PA-C  naproxen  (NAPROSYN ) 500 MG tablet Take 1 tablet (500 mg total) by mouth 2 (two) times daily with a meal. 05/07/24   Booker Darice SAUNDERS, FNP  phentermine 15 MG capsule Take 15 mg by mouth daily.    [provider]  triamcinolone  cream (KENALOG ) 0.1 % Apply 1 Application topically 2 (two) times daily. 02/02/24   Booker Darice SAUNDERS, FNP  Ubrogepant  (UBRELVY ) 100 MG TABS Take 1 tablet (100 mg total) by mouth as needed (for migraine). May repeat a dose in 2 hours if needed 03/25/24   Whitfield Harlene, NP  zolmitriptan  (ZOMIG ) 5 MG tablet Take 1 tablet (5 mg total) by mouth as  needed for migraine. 03/26/24   Whitfield Raisin, NP    Current Outpatient Medications  Medication Sig Dispense Refill   amitriptyline  (ELAVIL ) 50 MG tablet Take 1 tablet (50 mg total) by mouth at bedtime. 90 tablet 3   topiramate  (TOPAMAX ) 25 MG tablet Take by mouth.     hydrocortisone  (ANUSOL -HC) 2.5 % rectal cream Place 1 Application rectally daily. 30 g 1   naproxen  (NAPROSYN ) 500 MG tablet Take 1 tablet (500 mg total) by mouth 2 (two) times daily with a meal. 30  tablet 0   phentermine 15 MG capsule Take 15 mg by mouth daily.     triamcinolone  cream (KENALOG ) 0.1 % Apply 1 Application topically 2 (two) times daily. 30 g 0   Ubrogepant  (UBRELVY ) 100 MG TABS Take 1 tablet (100 mg total) by mouth as needed (for migraine). May repeat a dose in 2 hours if needed 16 tablet 7   zolmitriptan  (ZOMIG ) 5 MG tablet Take 1 tablet (5 mg total) by mouth as needed for migraine. 10 tablet 6   Current Facility-Administered Medications  Medication Dose Route Frequency Provider Last Rate Last Admin   0.9 %  sodium chloride  infusion  500 mL Intravenous Continuous Stacia Glendia BRAVO, MD        Allergies as of 01/02/2025 - Review Complete 01/02/2025  Allergen Reaction Noted   Fluconazole Hives, Swelling, and Rash 08/19/2014   Sulfamethoxazole-trimethoprim Hives, Swelling, and Rash 08/26/2014    Family History  Problem Relation Age of Onset   Colon polyps Mother        unknown number   Miscarriages / Stillbirths Mother    Hypertension Mother    Diabetes Mother    Colon polyps Father        less than 10 lifetime polyps   Bone cancer Maternal Uncle        or other primary? mother's paternal half brother; d. 77-70s   Prostate cancer Maternal Uncle        dx 60s-70s   Esophageal cancer Maternal Grandmother        or lymphoma? d. 40s   Hyperlipidemia Maternal Grandmother    Hypertension Maternal Grandmother    Aneurysm Maternal Grandfather    Hyperlipidemia Paternal Grandmother    Hypertension Paternal Grandmother    Hyperlipidemia Paternal Grandfather    Hypertension Paternal Grandfather    Stroke Paternal Grandfather    Colon cancer Neg Hx    Rectal cancer Neg Hx    Stomach cancer Neg Hx     Social History   Socioeconomic History   Marital status: Married    Spouse name: Not on file   Number of children: 1   Years of education: Not on file   Highest education level: Bachelor's degree (e.g., BA, AB, BS)  Occupational History    Employer: [employer]  Tobacco Use   Smoking status: Former    Current packs/day: 0.18    Types: Cigarettes   Smokeless tobacco: Never  Vaping Use   Vaping status: Never Used  Substance and Sexual Activity   Alcohol use: Not Currently   Drug use: Not Currently   Sexual activity: Not on file  Other Topics Concern   Not on file  Social History Narrative   Not on file   Social Drivers of Health   Tobacco Use: Medium Risk (01/02/2025)   Patient History    Smoking Tobacco Use: Former    Smokeless Tobacco Use: Never    Passive Exposure: Not  on file  Financial Resource Strain: Low Risk (07/09/2024)   Overall Financial Resource Strain (CARDIA)    Difficulty of Paying Living Expenses: Not hard at all  Food Insecurity: No Food Insecurity (07/09/2024)   Epic    Worried About Programme Researcher, Broadcasting/film/video in the Last Year: Never true    Ran Out of Food in the Last Year: Never true  Transportation Needs: No Transportation Needs (07/09/2024)   Epic    Lack of Transportation (Medical): No    Lack of Transportation (Non-Medical): No  Physical Activity: Sufficiently Active (07/09/2024)   Exercise Vital Sign    Days of Exercise per Week: 4 days    Minutes of Exercise per Session: 40 min  Stress: No Stress Concern Present (07/09/2024)   [employer] of Occupational Health - Occupational Stress Questionnaire    Feeling of Stress: Only a little  Social Connections: Moderately Isolated (07/09/2024)   Social Connection and Isolation Panel    Frequency of Communication with Friends and Family: More than three times a week    Frequency of Social Gatherings with Friends and Family: Three times a week    Attends Religious Services: Never    Active Member of Clubs or Organizations: No    Attends Banker Meetings: Not on file    Marital Status: Married  Catering Manager Violence: Not At Risk (04/27/2023)   Humiliation, Afraid, Rape, and Kick questionnaire    Fear of Current or Ex-Partner: No     Emotionally Abused: No    Physically Abused: No    Sexually Abused: No  Depression (PHQ2-9): Low Risk (07/09/2024)   Depression (PHQ2-9)    PHQ-2 Score: 0  Recent Concern: Depression (PHQ2-9) - Medium Risk (05/08/2024)   Depression (PHQ2-9)    PHQ-2 Score: 5  Alcohol Screen: Low Risk (07/09/2024)   Alcohol Screen    Last Alcohol Screening Score (AUDIT): 0  Housing: Low Risk (07/09/2024)   Epic    Unable to Pay for Housing in the Last Year: No    Number of Times Moved in the Last Year: 0    Homeless in the Last Year: No  Utilities: Not At Risk (04/27/2023)   [facility] Utilities    Threatened with loss of utilities: No  Health Literacy: Not on file    Review of Systems:  All other review of systems negative except as mentioned in the HPI.  Physical Exam: Vital signs BP 117/65   Pulse 92   Temp 98.2 F (36.8 C) (Temporal)   Ht 5' 3 (1.6 m)   Wt 220 lb (99.8 kg)   SpO2 99%   BMI 38.97 kg/m   General:   Alert,  Well-developed, well-nourished, pleasant and cooperative in NAD Airway:  Mallampati 2 Lungs:  Clear throughout to auscultation.   Heart:  Regular rate and rhythm; no murmurs, clicks, rubs,  or gallops. Abdomen:  Soft, nontender and nondistended. Normal bowel sounds.   Neuro/Psych:  Normal mood and affect. A and O x 3   Deborah Lazcano E. Stacia, MD [facility]

## 2025-01-02 NOTE — Patient Instructions (Signed)
Handout provided on polyps.  Resume previous diet.  Continue present medications.  Await pathology results.  Repeat colonoscopy (date not yet determined) for surveillance based on pathology results.   YOU HAD AN ENDOSCOPIC PROCEDURE TODAY AT THE [facility]:   Refer to the procedure report that was given to you for any specific questions about what was found during the examination.  If the procedure report does not answer your questions, please call your gastroenterologist to clarify.  If you requested that your care partner not be given the details of your procedure findings, then the procedure report has been included in a sealed envelope for you to review at your convenience later.  YOU SHOULD EXPECT: Some feelings of bloating in the abdomen. Passage of more gas than usual.  Walking can help get rid of the air that was put into your GI tract during the procedure and reduce the bloating. If you had a lower endoscopy (such as a colonoscopy or flexible sigmoidoscopy) you may notice spotting of blood in your stool or on the toilet paper. If you underwent a bowel prep for your procedure, you may not have a normal bowel movement for a few days.  Please Note:  You might notice some irritation and congestion in your nose or some drainage.  This is from the oxygen used during your procedure.  There is no need for concern and it should clear up in a day or so.  SYMPTOMS TO REPORT IMMEDIATELY:  Following lower endoscopy (colonoscopy or flexible sigmoidoscopy):  Excessive amounts of blood in the stool  Significant tenderness or worsening of abdominal pains  Swelling of the abdomen that is new, acute  Fever of 100F or higher  For urgent or emergent issues, a gastroenterologist can be reached at any hour by calling [phone]. Do not use MyChart messaging for urgent concerns.    DIET:  We do recommend a small meal at first, but then you may proceed to your regular diet.  Drink plenty  of fluids but you should avoid alcoholic beverages for 24 hours.  ACTIVITY:  You should plan to take it easy for the rest of today and you should NOT DRIVE or use heavy machinery until tomorrow (because of the sedation medicines used during the test).    FOLLOW UP: Our staff will call the number listed on your records the next business day following your procedure.  We will call around 7:15- 8:00 am to check on you and address any questions or concerns that you may have regarding the information given to you following your procedure. If we do not reach you, we will leave a message.     If any biopsies were taken you will be contacted by phone or by letter within the next 1-3 weeks.  Please call us at [phone] if you have not heard about the biopsies in 3 weeks.    SIGNATURES/CONFIDENTIALITY: You and/or your care partner have signed paperwork which will be entered into your electronic medical record.  These signatures attest to the fact that that the information above on your After Visit Summary has been reviewed and is understood.  Full responsibility of the confidentiality of this discharge information lies with you and/or your care-partner.

## 2025-01-02 NOTE — Progress Notes (Signed)
 Vss nad trans to pacu

## 2025-01-02 NOTE — Progress Notes (Signed)
 Called to room to assist during endoscopic procedure.  Patient ID and intended procedure confirmed with present staff. Received instructions for my participation in the procedure from the performing physician.

## 2025-01-02 NOTE — Progress Notes (Signed)
 Pt's states no medical or surgical changes since previsit or office visit.

## 2025-01-02 NOTE — Op Note (Signed)
 [facility] Patient Name: Bailey Schwartz Procedure Date: 01/02/2025 7:49 AM MRN: [id number] Endoscopist: Glendia E. Stacia , MD, [id number] Age: 39 Referring MD:  Date of Birth: 07-Aug-1986 Gender: Female Account #: [id number] Procedure:                Colonoscopy Indications:              Rectal bleeding Medicines:                Monitored Anesthesia Care Procedure:                Pre-Anesthesia Assessment:                           - Prior to the procedure, a History and Physical                            was performed, and patient medications and                            allergies were reviewed. The patient's tolerance of                            previous anesthesia was also reviewed. The risks                            and benefits of the procedure and the sedation                            options and risks were discussed with the patient.                            All questions were answered, and informed consent                            was obtained. Prior Anticoagulants: The patient has                            taken no anticoagulant or antiplatelet agents. ASA                            Grade Assessment: II - A patient with mild systemic                            disease. After reviewing the risks and benefits,                            the patient was deemed in satisfactory condition to                            undergo the procedure.                           After obtaining informed consent, the colonoscope  was passed under direct vision. Throughout the                            procedure, the patient's blood pressure, pulse, and                            oxygen saturations were monitored continuously. The                            CF [id number] #[id number] was introduced through the anus                            and advanced to the the terminal ileum, with                            identification of the appendiceal  orifice and IC                            valve. The colonoscopy was performed without                            difficulty. The patient tolerated the procedure                            well. The quality of the bowel preparation was                            excellent. The terminal ileum, ileocecal valve,                            appendiceal orifice, and rectum were photographed.                            The bowel preparation used was SUPREP via split                            dose instruction. Scope In: 8:10:13 AM Scope Out: 8:29:40 AM Scope Withdrawal Time: 0 hours 16 minutes 37 seconds  Total Procedure Duration: 0 hours 19 minutes 27 seconds  Findings:                 The perianal and digital rectal examinations were                            normal. Pertinent negatives include normal                            sphincter tone and no palpable rectal lesions.                           A 2 mm polyp was found in the sigmoid colon. The                            polyp was sessile. The polyp was removed with  a                            cold snare. Resection and retrieval were complete.                            Estimated blood loss was minimal.                           A 4 mm polyp was found in the rectum. The polyp was                            sessile. The polyp was removed with a cold snare.                            Resection and retrieval were complete. Estimated                            blood loss was minimal.                           The exam was otherwise normal throughout the                            examined colon.                           The terminal ileum appeared normal.                           The retroflexed view of the distal rectum and anal                            verge was normal and showed no anal or rectal                            abnormalities. Complications:            No immediate complications. Estimated Blood Loss:     Estimated blood  loss was minimal. Impression:               - One 2 mm polyp in the sigmoid colon, removed with                            a cold snare. Resected and retrieved.                           - One 4 mm polyp in the rectum, removed with a cold                            snare. Resected and retrieved.                           - The examined portion of the ileum was normal.                           -  The distal rectum and anal verge are normal on                            retroflexion view.                           - The patient's rectal bleeding can be attributed                            to internal hemorrhoids. Recommendation:           - Patient has a contact number available for                            emergencies. The signs and symptoms of potential                            delayed complications were discussed with the                            patient. Return to normal activities tomorrow.                            Written discharge instructions were provided to the                            patient.                           - Resume previous diet.                           - Continue present medications.                           - Await pathology results.                           - Repeat colonoscopy (date not yet determined) for                            surveillance based on pathology results. Travarius Lange E. Stacia, MD 01/02/2025 8:37:05 AM This report has been signed electronically.

## 2025-01-03 ENCOUNTER — Telehealth: Payer: Self-pay

## 2025-01-03 NOTE — Telephone Encounter (Signed)
" °  Follow up Call-     01/02/2025    7:21 AM  Call back number  Post procedure Call Back phone  # [phone]  Permission to leave phone message Yes     Patient questions:  Do you have a fever, pain , or abdominal swelling? No. Pain Score  0 *  Have you tolerated food without any problems? Yes.    Have you been able to return to your normal activities? Yes.    Do you have any questions about your discharge instructions: Diet   No. Medications  No. Follow up visit  No.  Do you have questions or concerns about your Care? No.  Actions: * If pain score is 4 or above: No action needed, pain <4.   "
